# Patient Record
Sex: Female | Born: 1985 | Race: White | Hispanic: No | Marital: Single | Smoking: Never smoker
Health system: Southern US, Community
[De-identification: ages and names within clinical notes are randomized; demographics above are authoritative.]

## PROBLEM LIST (undated history)

## (undated) DIAGNOSIS — G43909 Migraine, unspecified, not intractable, without status migrainosus: Secondary | ICD-10-CM

## (undated) DIAGNOSIS — B019 Varicella without complication: Secondary | ICD-10-CM

## (undated) DIAGNOSIS — J45909 Unspecified asthma, uncomplicated: Secondary | ICD-10-CM

## (undated) DIAGNOSIS — I10 Essential (primary) hypertension: Secondary | ICD-10-CM

## (undated) HISTORY — DX: Essential (primary) hypertension: I10

## (undated) HISTORY — DX: Varicella without complication: B01.9

## (undated) HISTORY — PX: OTHER SURGICAL HISTORY: SHX169

## (undated) HISTORY — PX: TONSILLECTOMY: SUR1361

## (undated) HISTORY — PX: SKIN BIOPSY: SHX1

## (undated) HISTORY — DX: Migraine, unspecified, not intractable, without status migrainosus: G43.909

---

## 2018-01-23 DIAGNOSIS — K2100 Gastro-esophageal reflux disease with esophagitis, without bleeding: Secondary | ICD-10-CM | POA: Insufficient documentation

## 2018-01-23 DIAGNOSIS — G43009 Migraine without aura, not intractable, without status migrainosus: Secondary | ICD-10-CM | POA: Insufficient documentation

## 2018-01-23 DIAGNOSIS — I10 Essential (primary) hypertension: Secondary | ICD-10-CM | POA: Insufficient documentation

## 2018-01-23 DIAGNOSIS — M47812 Spondylosis without myelopathy or radiculopathy, cervical region: Secondary | ICD-10-CM | POA: Insufficient documentation

## 2018-09-30 DIAGNOSIS — A630 Anogenital (venereal) warts: Secondary | ICD-10-CM | POA: Insufficient documentation

## 2018-09-30 DIAGNOSIS — Z9889 Other specified postprocedural states: Secondary | ICD-10-CM | POA: Insufficient documentation

## 2018-11-07 DIAGNOSIS — Z01419 Encounter for gynecological examination (general) (routine) without abnormal findings: Secondary | ICD-10-CM | POA: Insufficient documentation

## 2018-11-20 DIAGNOSIS — R87610 Atypical squamous cells of undetermined significance on cytologic smear of cervix (ASC-US): Secondary | ICD-10-CM | POA: Insufficient documentation

## 2018-12-20 ENCOUNTER — Encounter (HOSPITAL_BASED_OUTPATIENT_CLINIC_OR_DEPARTMENT_OTHER): Payer: Self-pay | Admitting: Emergency Medicine

## 2018-12-20 ENCOUNTER — Emergency Department (HOSPITAL_BASED_OUTPATIENT_CLINIC_OR_DEPARTMENT_OTHER)
Admission: EM | Admit: 2018-12-20 | Discharge: 2018-12-21 | Disposition: A | Discharge status: Home / Self Care | Payer: Medicaid Other | Attending: Emergency Medicine | Admitting: Emergency Medicine

## 2018-12-20 ENCOUNTER — Other Ambulatory Visit: Payer: Self-pay

## 2018-12-20 DIAGNOSIS — J45909 Unspecified asthma, uncomplicated: Secondary | ICD-10-CM | POA: Insufficient documentation

## 2018-12-20 DIAGNOSIS — R430 Anosmia: Secondary | ICD-10-CM | POA: Diagnosis not present

## 2018-12-20 DIAGNOSIS — U071 COVID-19: Secondary | ICD-10-CM | POA: Insufficient documentation

## 2018-12-20 DIAGNOSIS — Z20822 Contact with and (suspected) exposure to covid-19: Secondary | ICD-10-CM

## 2018-12-20 DIAGNOSIS — Z20828 Contact with and (suspected) exposure to other viral communicable diseases: Secondary | ICD-10-CM

## 2018-12-20 DIAGNOSIS — G501 Atypical facial pain: Secondary | ICD-10-CM | POA: Diagnosis present

## 2018-12-20 HISTORY — DX: Unspecified asthma, uncomplicated: J45.909

## 2018-12-20 NOTE — ED Triage Notes (Signed)
Patient states that she has had a sinus infection x 3 -4 months. She has been to multiple drs to assist with this has been referred to ENT for follow up but they will not see her because she lost her sense of taste and smell 3 weeks ago - they want her to get COVID testing prior to seeing her  - patient states that she continues to have sinus pain and pressure. The patient states that she has pressure in her head and behind her eyes

## 2018-12-20 NOTE — ED Provider Notes (Signed)
[facility] EMERGENC[facility] DEPARTMENT Provider Note   CSN: [id number] Arrival date & time: 12/20/18  2324     History   Chief Complaint Chief Complaint  Patient presents with  . Facial Pain    HPI Rebecca Blackburn is a 33 y.o. female.     The history is provided by the patient.  Illness Location:  Nose and face Quality:  Popping of ear and chronic congestion for months no 3 weeks of anosmia and lack of taste.  Is here because ENT will not see her without a covid test Severity:  Moderate Onset quality:  Gradual Timing:  Constant Progression:  Worsening Chronicity:  Chronic Context:  Ongoing congestions and ear popping anosmia is new in 3 weeks Relieved by:  Nothing Worsened by:  Nothing Ineffective treatments:  None tried Associated symptoms: no abdominal pain, no chest pain, no congestion, no cough, no diarrhea, no ear pain, no fatigue, no fever, no headaches, no loss of consciousness, no myalgias, no nausea, no rash, no rhinorrhea, no shortness of breath, no sore throat, no vomiting and no wheezing   Risk factors:  None Went to see ENT for ongoing issues but they declined to see her when she told them she had anosmia and loss of taste and told her she needed a negative covid test.    Past Medical History:  Diagnosis Date  . Asthma     There are no active problems to display for this patient.   History reviewed. No pertinent surgical history.   OB History   No obstetric history on file.      Home Medications    Prior to Admission medications   Not on File    Family History History reviewed. No pertinent family history.  Social History Social History   Tobacco Use  . Smoking status: Never Smoker  . Smokeless tobacco: Never Used  Substance Use Topics  . Alcohol use: Never    Frequency: Never  . Drug use: Never     Allergies   Amoxicillin, Cefaclor, and Cefuroxime axetil   Review of Systems Review of Systems  Constitutional: Negative  for fatigue and fever.  HENT: Negative for congestion, ear pain, rhinorrhea and sore throat.   Eyes: Negative for visual disturbance.  Respiratory: Negative for cough, shortness of breath and wheezing.   Cardiovascular: Negative for chest pain.  Gastrointestinal: Negative for abdominal pain, diarrhea, nausea and vomiting.  Genitourinary: Negative for difficulty urinating.  Musculoskeletal: Negative for myalgias.  Skin: Negative for rash.  Neurological: Negative for loss of consciousness and headaches.  Psychiatric/Behavioral: Negative for agitation.  All other systems reviewed and are negative.    Physical Exam Updated Vital Signs BP 133/80 (BP Location: Left Arm)   Pulse 75   Temp 98.4 F (36.9 C) (Oral)   Resp 18   Ht 5\' 1"  (1.549 m)   Wt 103.4 kg   LMP 11/27/2018   SpO2 100%   BMI 43.08 kg/m   Physical Exam Vitals signs and nursing note reviewed.  Constitutional:      General: She is not in acute distress.    Appearance: Normal appearance. She is not ill-appearing.  HENT:     Head: Normocephalic and atraumatic.     Right Ear: Tympanic membrane normal.     Left Ear: Tympanic membrane normal.     Nose: Nose normal. No rhinorrhea.  Eyes:     Conjunctiva/sclera: Conjunctivae normal.     Pupils: Pupils are equal, round, and reactive to light.  Neck:     Musculoskeletal: Normal range of motion and neck supple.  Cardiovascular:     Rate and Rhythm: Normal rate and regular rhythm.     Pulses: Normal pulses.     Heart sounds: Normal heart sounds.  Pulmonary:     Effort: Pulmonary effort is normal.     Breath sounds: Normal breath sounds.  Abdominal:     General: Abdomen is flat. Bowel sounds are normal.     Tenderness: There is no abdominal tenderness. There is no guarding.  Musculoskeletal: Normal range of motion.  Skin:    General: Skin is warm and dry.     Capillary Refill: Capillary refill takes less than 2 seconds.  Neurological:     General: No focal deficit  present.     Mental Status: She is alert and oriented to person, place, and time.  Psychiatric:        Mood and Affect: Mood normal.        Behavior: Behavior normal.      ED Treatments / Results  Labs (all labs ordered are listed, but only abnormal results are displayed) Labs Reviewed  NOVEL CORONAVIRUS, NAA (HOSP ORDER, SEND-OUT TO REF LAB; TAT 18-24 HRS)    EKG None  Radiology No results found.  Procedures Procedures (including critical care time)  Medications Ordered in ED Medications - No data to display    Issues are chronic in nature.  She meets no criteria for acute sinus infection, no fevers nor discharge nor sinus tenderness on exam.  She has been seen multiple times for the issues and was told she needs to see ENT.  We have performed covid swabbing and patient has been given strict home isolation instructions pending results.  If results is positive she is to self quarantine for 16 days.  If negative she is to quarantine until symptom free.  Isolation instructions also provided on discharge paperwork.   Rebecca Blackburn was evaluated in Emergency Department on 12/21/2018 for the symptoms described in the history of present illness. She was evaluated in the context of the global COVID-19 pandemic, which necessitated consideration that the patient might be at risk for infection with the SARS-CoV-2 virus that causes COVID-19. Institutional protocols and algorithms that pertain to the evaluation of patients at risk for COVID-19 are in a state of rapid change based on information released by regulatory bodies including the CDC and federal and state organizations. These policies and algorithms were followed during the patient's care in the ED.    Final Clinical Impressions(s) / ED Diagnoses   Return for weakness, numbness, changes in vision or speech, fevers >100.4 unrelieved by medication, shortness of breath, intractable vomiting, or diarrhea, abdominal pain, Inability to  tolerate liquids or food, cough, altered mental status or any concerns. No signs of systemic illness or infection. The patient is nontoxic-appearing on exam and vital signs are within normal limits.   I have reviewed the triage vital signs and the nursing notes. Pertinent labs &imaging results that were available during my care of the patient were reviewed by me and considered in my medical decision making (see chart for details).  After history, exam, and medical workup I feel the patient has been appropriately medically screened and is safe for discharge home. Pertinent diagnoses were discussed with the patient. Patient was given return precautions.   Ottilie Wigglesworth, MD 12/21/18 0300

## 2018-12-21 ENCOUNTER — Encounter (HOSPITAL_BASED_OUTPATIENT_CLINIC_OR_DEPARTMENT_OTHER): Payer: Self-pay | Admitting: Emergency Medicine

## 2018-12-21 MED ORDER — ACETAMINOPHEN 500 MG PO TABS
1000.0000 mg | ORAL_TABLET | Freq: Once | ORAL | Status: AC
  Administered 2018-12-21: 1000 mg via ORAL
  Filled 2018-12-21: qty 2

## 2018-12-21 MED ORDER — AZELASTINE HCL 0.1 % NA SOLN: 2.0000 | Freq: Two times a day (BID) | NASAL | 0 refills | Status: DC

## 2018-12-21 MED ORDER — LORATADINE 10 MG PO TABS
10.0000 mg | ORAL_TABLET | Freq: Once | ORAL | Status: AC
  Administered 2018-12-21: 10 mg via ORAL
  Filled 2018-12-21: qty 1

## 2018-12-21 NOTE — Discharge Instructions (Addendum)
Person Under Monitoring Name: Rebecca Blackburn  Location: [place] [address]   Infection Prevention Recommendations for Individuals Confirmed to have, or Being Evaluated for, 2019 Novel Coronavirus (COVID-19) Infection Who Receive Care at Home  Individuals who are confirmed to have, or are being evaluated for, COVID-19 should follow the prevention steps below until a healthcare provider or local or state health department says they can return to normal activities.  Stay home except to get medical care You should restrict activities outside your home, except for getting medical care. Do not go to work, school, or public areas, and do not use public transportation or taxis.  Call ahead before visiting your doctor Before your medical appointment, call the healthcare provider and tell them that you have, or are being evaluated for, COVID-19 infection. This will help the healthcare providers office take steps to keep other people from getting infected. Ask your healthcare provider to call the local or state health department.  Monitor your symptoms Seek prompt medical attention if your illness is worsening (e.g., difficulty breathing). Before going to your medical appointment, call the healthcare provider and tell them that you have, or are being evaluated for, COVID-19 infection. Ask your healthcare provider to call the local or state health department.  Wear a facemask You should wear a facemask that covers your nose and mouth when you are in the same room with other people and when you visit a healthcare provider. People who live with or visit you should also wear a facemask while they are in the same room with you.  Separate yourself from other people in your home As much as possible, you should stay in a different room from other people in your home. Also, you should use a separate bathroom, if available.  Avoid sharing household items You should  not share dishes, drinking glasses, cups, eating utensils, towels, bedding, or other items with other people in your home. After using these items, you should wash them thoroughly with soap and water.  Cover your coughs and sneezes Cover your mouth and nose with a tissue when you cough or sneeze, or you can cough or sneeze into your sleeve. Throw used tissues in a lined trash can, and immediately wash your hands with soap and water for at least 20 seconds or use an alcohol-based hand rub.  Wash your [facility] your hands often and thoroughly with soap and water for at least 20 seconds. You can use an alcohol-based hand sanitizer if soap and water are not available and if your hands are not visibly dirty. Avoid touching your eyes, nose, and mouth with unwashed hands.   Prevention Steps for Caregivers and Household Members of Individuals Confirmed to have, or Being Evaluated for, COVID-19 Infection Being Cared for in the Home  If you live with, or provide care at home for, a person confirmed to have, or being evaluated for, COVID-19 infection please follow these guidelines to prevent infection:  Follow healthcare providers instructions Make sure that you understand and can help the patient follow any healthcare provider instructions for all care.  Provide for the patients basic needs You should help the patient with basic needs in the home and provide support for getting groceries, prescriptions, and other personal needs.  Monitor the patients symptoms If they are getting sicker, call his or her medical provider and tell them that the patient has, or is being evaluated for, COVID-19 infection. This will help the healthcare  providers office take steps to keep other people from getting infected. Ask the healthcare provider to call the local or state health department.  Limit the number of people who have contact with the patient If possible, have only one caregiver for the  patient. Other household members should stay in another home or place of residence. If this is not possible, they should stay in another room, or be separated from the patient as much as possible. Use a separate bathroom, if available. Restrict visitors who do not have an essential need to be in the home.  Keep older adults, very young children, and other sick people away from the patient Keep older adults, very young children, and those who have compromised immune systems or chronic health conditions away from the patient. This includes people with chronic heart, lung, or kidney conditions, diabetes, and cancer.  Ensure good ventilation Make sure that shared spaces in the home have good air flow, such as from an air conditioner or an opened window, weather permitting.  Wash your hands often Wash your hands often and thoroughly with soap and water for at least 20 seconds. You can use an alcohol based hand sanitizer if soap and water are not available and if your hands are not visibly dirty. Avoid touching your eyes, nose, and mouth with unwashed hands. Use disposable paper towels to dry your hands. If not available, use dedicated cloth towels and replace them when they become wet.  Wear a facemask and gloves Wear a disposable facemask at all times in the room and gloves when you touch or have contact with the patients blood, body fluids, and/or secretions or excretions, such as sweat, saliva, sputum, nasal mucus, vomit, urine, or feces.  Ensure the mask fits over your nose and mouth tightly, and do not touch it during use. Throw out disposable facemasks and gloves after using them. Do not reuse. Wash your hands immediately after removing your facemask and gloves. If your personal clothing becomes contaminated, carefully remove clothing and launder. Wash your hands after handling contaminated clothing. Place all used disposable facemasks, gloves, and other waste in a lined container before  disposing them with other household waste. Remove gloves and wash your hands immediately after handling these items.  Do not share dishes, glasses, or other household items with the patient Avoid sharing household items. You should not share dishes, drinking glasses, cups, eating utensils, towels, bedding, or other items with a patient who is confirmed to have, or being evaluated for, COVID-19 infection. After the person uses these items, you should wash them thoroughly with soap and water.  Wash laundry thoroughly Immediately remove and wash clothes or bedding that have blood, body fluids, and/or secretions or excretions, such as sweat, saliva, sputum, nasal mucus, vomit, urine, or feces, on them. Wear gloves when handling laundry from the patient. Read and follow directions on labels of laundry or clothing items and detergent. In general, wash and dry with the warmest temperatures recommended on the label.  Clean all areas the individual has used often Clean all touchable surfaces, such as counters, tabletops, doorknobs, bathroom fixtures, toilets, phones, keyboards, tablets, and bedside tables, every day. Also, clean any surfaces that may have blood, body fluids, and/or secretions or excretions on them. Wear gloves when cleaning surfaces the patient has come in contact with. Use a diluted bleach solution (e.g., dilute bleach with 1 part bleach and 10 parts water) or a household disinfectant with a label that says EPA-registered for coronaviruses. To make  a bleach solution at home, add 1 tablespoon of bleach to 1 quart (4 cups) of water. For a larger supply, add  cup of bleach to 1 gallon (16 cups) of water. Read labels of cleaning products and follow recommendations provided on product labels. Labels contain instructions for safe and effective use of the cleaning product including precautions you should take when applying the product, such as wearing gloves or eye protection and making sure you  have good ventilation during use of the product. Remove gloves and wash hands immediately after cleaning.  Monitor yourself for signs and symptoms of illness Caregivers and household members are considered close contacts, should monitor their health, and will be asked to limit movement outside of the home to the extent possible. Follow the monitoring steps for close contacts listed on the symptom monitoring form.   ? If you have additional questions, contact your local health department or call the epidemiologist on call at [phone] (available 24/7). ? This guidance is subject to change. For the most up-to-date guidance from [facility], please refer to their website: [email or web address]

## 2018-12-22 LAB — NOVEL CORONAVIRUS, NAA (HOSP ORDER, SEND-OUT TO REF LAB; TAT 18-24 HRS): SARS-CoV-2, NAA: DETECTED — AB

## 2019-01-01 ENCOUNTER — Other Ambulatory Visit: Payer: Self-pay

## 2019-01-01 ENCOUNTER — Emergency Department (HOSPITAL_BASED_OUTPATIENT_CLINIC_OR_DEPARTMENT_OTHER)
Admission: EM | Admit: 2019-01-01 | Discharge: 2019-01-01 | Disposition: A | Discharge status: Home / Self Care | Payer: Medicaid Other | Attending: Emergency Medicine | Admitting: Emergency Medicine

## 2019-01-01 ENCOUNTER — Encounter (HOSPITAL_BASED_OUTPATIENT_CLINIC_OR_DEPARTMENT_OTHER): Payer: Self-pay | Admitting: Emergency Medicine

## 2019-01-01 DIAGNOSIS — Z880 Allergy status to penicillin: Secondary | ICD-10-CM | POA: Diagnosis not present

## 2019-01-01 DIAGNOSIS — Z881 Allergy status to other antibiotic agents status: Secondary | ICD-10-CM | POA: Insufficient documentation

## 2019-01-01 DIAGNOSIS — R42 Dizziness and giddiness: Secondary | ICD-10-CM | POA: Diagnosis not present

## 2019-01-01 DIAGNOSIS — G44209 Tension-type headache, unspecified, not intractable: Secondary | ICD-10-CM | POA: Insufficient documentation

## 2019-01-01 DIAGNOSIS — J45909 Unspecified asthma, uncomplicated: Secondary | ICD-10-CM | POA: Diagnosis not present

## 2019-01-01 DIAGNOSIS — R5383 Other fatigue: Secondary | ICD-10-CM | POA: Insufficient documentation

## 2019-01-01 DIAGNOSIS — R519 Headache, unspecified: Secondary | ICD-10-CM

## 2019-01-01 DIAGNOSIS — Z79899 Other long term (current) drug therapy: Secondary | ICD-10-CM | POA: Diagnosis not present

## 2019-01-01 LAB — URINALYSIS, ROUTINE W REFLEX MICROSCOPIC
Bilirubin Urine: NEGATIVE
Glucose, UA: NEGATIVE mg/dL
Ketones, ur: NEGATIVE mg/dL
Leukocytes,Ua: NEGATIVE
Nitrite: NEGATIVE
Protein, ur: NEGATIVE mg/dL
Specific Gravity, Urine: 1.02 (ref 1.005–1.030)
pH: 5.5 (ref 5.0–8.0)

## 2019-01-01 LAB — URINALYSIS, MICROSCOPIC (REFLEX)

## 2019-01-01 LAB — CBC WITH DIFFERENTIAL/PLATELET
Abs Immature Granulocytes: 0.03 10*3/uL (ref 0.00–0.07)
Basophils Absolute: 0 10*3/uL (ref 0.0–0.1)
Basophils Relative: 0 %
Eosinophils Absolute: 0.1 10*3/uL (ref 0.0–0.5)
Eosinophils Relative: 1 %
HCT: 42.9 % (ref 36.0–46.0)
Hemoglobin: 13.9 g/dL (ref 12.0–15.0)
Immature Granulocytes: 0 %
Lymphocytes Relative: 28 %
Lymphs Abs: 2.4 10*3/uL (ref 0.7–4.0)
MCH: 28.1 pg (ref 26.0–34.0)
MCHC: 32.4 g/dL (ref 30.0–36.0)
MCV: 86.8 fL (ref 80.0–100.0)
Monocytes Absolute: 0.4 10*3/uL (ref 0.1–1.0)
Monocytes Relative: 5 %
Neutro Abs: 5.8 10*3/uL (ref 1.7–7.7)
Neutrophils Relative %: 66 %
Platelets: 254 10*3/uL (ref 150–400)
RBC: 4.94 MIL/uL (ref 3.87–5.11)
RDW: 13.4 % (ref 11.5–15.5)
WBC: 8.7 10*3/uL (ref 4.0–10.5)
nRBC: 0 % (ref 0.0–0.2)

## 2019-01-01 LAB — COMPREHENSIVE METABOLIC PANEL
ALT: 16 U/L (ref 0–44)
AST: 17 U/L (ref 15–41)
Albumin: 3.9 g/dL (ref 3.5–5.0)
Alkaline Phosphatase: 62 U/L (ref 38–126)
Anion gap: 8 (ref 5–15)
BUN: 15 mg/dL (ref 6–20)
CO2: 24 mmol/L (ref 22–32)
Calcium: 9 mg/dL (ref 8.9–10.3)
Chloride: 104 mmol/L (ref 98–111)
Creatinine, Ser: 0.72 mg/dL (ref 0.44–1.00)
GFR calc Af Amer: >60 mL/min (ref >60)
GFR calc non Af Amer: >60 mL/min (ref >60)
Glucose, Bld: 106 mg/dL — ABNORMAL HIGH (ref 70–99)
Potassium: 3.4 mmol/L — ABNORMAL LOW (ref 3.5–5.1)
Sodium: 136 mmol/L (ref 135–145)
Total Bilirubin: 0.3 mg/dL (ref 0.3–1.2)
Total Protein: 6.9 g/dL (ref 6.5–8.1)

## 2019-01-01 LAB — PREGNANCY, URINE: Preg Test, Ur: NEGATIVE

## 2019-01-01 MED ORDER — SODIUM CHLORIDE 0.9 % IV BOLUS
1000.0000 mL | Freq: Once | INTRAVENOUS | Status: AC
  Administered 2019-01-01: 1000 mL via INTRAVENOUS

## 2019-01-01 MED ORDER — KETOROLAC TROMETHAMINE 15 MG/ML IJ SOLN
15.0000 mg | Freq: Once | INTRAMUSCULAR | Status: AC
  Administered 2019-01-01: 15 mg via INTRAVENOUS
  Filled 2019-01-01: qty 1

## 2019-01-01 MED ORDER — POTASSIUM CHLORIDE CRYS ER 20 MEQ PO TBCR
20.0000 meq | EXTENDED_RELEASE_TABLET | Freq: Once | ORAL | Status: AC
  Administered 2019-01-01: 20 meq via ORAL
  Filled 2019-01-01: qty 1

## 2019-01-01 NOTE — ED Provider Notes (Signed)
[facility] EM[facility]ENC[facility] DEPARTMENT Provider Note   CSN: [id number] Arrival date & time: 01/01/19  1232     History   Chief Complaint Chief Complaint  Patient presents with  . Headache    HPI Rebecca Blackburn is a 33 y.o. female.     33 year old female with past medical history including asthma who presents with headaches.  The patient presented here on 12/20/2018 and ultimately tested positive for COVID-19.  She has had loss of taste and smell, fatigues and remittent headaches.  She reports longstanding history of occasional migraines for which she used to be on Topamax; she took himself off of the medication 3 years ago.  She notes that yesterday she began feeling lightheaded and generally weak. She noted heart racing/palpitations feeling. She states her symptoms felt like an anxiety attack. She has had similar weakness symptoms before just prior to onset of headache.  She then began having a right-sided, muscle cramp feeling headache. She took flexeril and tylenol last night. She currently has dull headache. She continues to feel general fatigue. Occasional dry cough but no severe cough. No breathing problems. She reports distant h/o low potassium while on fluid pill and is currently following Keto diet; she's concerned that one of her electrolytes might be off balance. No vision changes.  The history is provided by the patient.  Headache   Past Medical History:  Diagnosis Date  . Asthma     There are no active problems to display for this patient.   History reviewed. No pertinent surgical history.   OB History   No obstetric history on file.      Home Medications    Prior to Admission medications   Medication Sig Start Date End Date Taking? Authorizing Provider  albuterol (PROAIR HFA) 108 (90 Base) MCG/ACT inhaler INHALE 2 PUFFS 3 TIMES A DAY 05/19/16  Yes [provider]  cyclobenzaprine (FLEXERIL) 10 MG tablet Take by mouth. 09/11/18 09/11/19 Yes  [provider]  azelastine (ASTELIN) 0.1 % nasal spray Place 2 sprays into both nostrils 2 (two) times daily. Use in each nostril as directed 12/21/18   Palumbo, April, MD  losartan (COZAAR) 100 MG tablet Take 100 mg by mouth daily. 09/15/18   [provider]    Family History No family history on file.  Social History Social History   Tobacco Use  . Smoking status: Never Smoker  . Smokeless tobacco: Never Used  Substance Use Topics  . Alcohol use: Never    Frequency: Never  . Drug use: Never     Allergies   Amoxicillin, Cefaclor, and Cefuroxime axetil   Review of Systems Review of Systems  Neurological: Positive for headaches.   All other systems reviewed and are negative except that which was mentioned in HPI   Physical Exam Updated Vital Signs BP 116/70 (BP Location: Right Arm)   Pulse 95   Temp 98.8 F (37.1 C) (Oral)   Resp 16   Ht 5\' 1"  (1.549 m)   Wt 104.3 kg   LMP 12/27/2018   SpO2 100%   BMI 43.46 kg/m   Physical Exam Vitals signs and nursing note reviewed.  Constitutional:      General: She is not in acute distress.    Appearance: She is well-developed.     Comments: Awake, alert  HENT:     Head: Normocephalic and atraumatic.  Eyes:     Extraocular Movements: Extraocular movements intact.     Conjunctiva/sclera: Conjunctivae normal.  Pupils: Pupils are equal, round, and reactive to light.  Neck:     Musculoskeletal: Neck supple.  Pulmonary:     Effort: Pulmonary effort is normal.  Abdominal:     General: There is no distension.  Skin:    General: Skin is warm and dry.  Neurological:     Mental Status: She is alert and oriented to person, place, and time.     Cranial Nerves: No cranial nerve deficit.     Motor: No abnormal muscle tone.     Deep Tendon Reflexes: Reflexes are normal and symmetric.     Comments: Fluent speech, normal finger-to-nose testing, negative pronator drift, no clonus 5/5 strength and normal  sensation x all 4 extremities  Psychiatric:        Mood and Affect: Mood normal.        Thought Content: Thought content normal.        Judgment: Judgment normal.      ED Treatments / Results  Labs (all labs ordered are listed, but only abnormal results are displayed) Labs Reviewed  CBC WITH DIFFERENTIAL/PLATELET  COMPREHENSIVE METABOLIC PANEL  PREGNANCY, URINE  URINALYSIS, ROUTINE W REFLEX MICROSCOPIC    EKG EKG Interpretation  Date/Time:  Thursday January 01 2019 14:06:01 EDT Ventricular Rate:  81 PR Interval:    QRS Duration: 94 QT Interval:  368 QTC Calculation: 428 R Axis:   72 Text Interpretation:  Sinus rhythm No previous ECGs available Confirmed by Theotis Burrow [phone]) on 01/01/2019 2:21:01 PM   Radiology No results found.  Procedures Procedures (including critical care time)  Medications Ordered in ED Medications  sodium chloride 0.9 % bolus 1,000 mL (1,000 mLs Intravenous New Bag/Given 01/01/19 1419)     Initial Impression / Assessment and Plan / ED Course  I have reviewed the triage vital signs and the nursing notes.  Pertinent labs that were available during my care of the patient were reviewed by me and considered in my medical decision making (see chart for details).       Neurologically intact on exam. VS reassuring. No sudden onset of headache or neuro deficits to suggest acute intracranial process.   I explained that fatigue/weakness may be lingering symptom of COVID-19 infection and we currently don't have enough evidence to predict how long lingering symptoms take to resolve. I also discussed need to f/u with PCP for further w/u.  I have ordered screening labs to evaluate electrolytes.  CBC is normal, no evidence of anemia.  The remainder of her lab work is pending and I am signing out to the oncoming provider.  Final Clinical Impressions(s) / ED Diagnoses   Final diagnoses:  None    ED Discharge Orders    None       ,  Wenda Overland, MD 01/01/19 [phone]

## 2019-01-01 NOTE — ED Triage Notes (Signed)
Pt c/o persistent HA; hx of migraines, but not taking Topamax; feels fatigued; tested positive for COVID on 12/20/18

## 2019-01-01 NOTE — ED Notes (Signed)
Patient transported to CT 

## 2019-01-13 DIAGNOSIS — M26629 Arthralgia of temporomandibular joint, unspecified side: Secondary | ICD-10-CM | POA: Insufficient documentation

## 2019-01-13 DIAGNOSIS — R42 Dizziness and giddiness: Secondary | ICD-10-CM | POA: Insufficient documentation

## 2019-01-19 ENCOUNTER — Encounter: Payer: Self-pay | Admitting: Physical Therapy

## 2019-01-19 ENCOUNTER — Ambulatory Visit: Payer: Medicaid Other | Attending: Otolaryngology | Admitting: Physical Therapy

## 2019-01-19 ENCOUNTER — Other Ambulatory Visit: Payer: Self-pay

## 2019-01-19 DIAGNOSIS — M542 Cervicalgia: Secondary | ICD-10-CM

## 2019-01-19 DIAGNOSIS — R42 Dizziness and giddiness: Secondary | ICD-10-CM | POA: Diagnosis present

## 2019-01-19 NOTE — Patient Instructions (Signed)
Access Code: [email or web address]  URL: [email or web address]  Date: 01/19/2019  Prepared by: Elsie Ra   Exercises  Seated Cervical Sidebending Stretch - 3 sets - 30 sec hold - 2x daily - 6x weekly  Seated Cervical Retraction - 10 reps - 2-3 sets - 2x daily - 6x weekly  Seated Gaze Stabilization with Head Rotation - 1 reps - 2 sets - 30 hold - 2x daily - 6x weekly  Seated Gaze Stabilization with Head Nod - 2 sets - 30 sec hold - 2x daily - 6x weekly  Supine Suboccipital Release with Tennis Balls - 1-3 sets - 1-2 min hold - 2x daily - 6x weekly  Patient Education  TENS Therapy
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
## 2019-01-27 ENCOUNTER — Other Ambulatory Visit: Payer: Self-pay

## 2019-01-27 ENCOUNTER — Ambulatory Visit: Payer: Medicaid Other | Admitting: Physical Therapy

## 2019-01-27 DIAGNOSIS — M542 Cervicalgia: Secondary | ICD-10-CM

## 2019-01-27 DIAGNOSIS — R42 Dizziness and giddiness: Secondary | ICD-10-CM
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
## 2019-02-09 ENCOUNTER — Other Ambulatory Visit: Payer: Self-pay

## 2019-02-09 ENCOUNTER — Ambulatory Visit: Payer: Medicaid Other | Admitting: Physical Therapy

## 2019-02-09 ENCOUNTER — Encounter: Payer: Self-pay | Admitting: Physical Therapy

## 2019-02-09 DIAGNOSIS — M542 Cervicalgia: Secondary | ICD-10-CM | POA: Diagnosis not present

## 2019-02-09 DIAGNOSIS — R42 Dizziness and giddiness: Secondary | ICD-10-CM
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
## 2019-02-09 NOTE — Patient Instructions (Signed)
Feet Apart (Compliant Surface) Head Motion - Eyes Closed    Stand on compliant surface: _pillows_______ with feet shoulder width apart. Close eyes and move head slowly, up and down. Repeat __10__ times per session. Do _1___ sessions per day.  Copyright  VHI. All rights reserved.

## 2019-02-13 DIAGNOSIS — L219 Seborrheic dermatitis, unspecified: Secondary | ICD-10-CM | POA: Insufficient documentation

## 2019-02-26 ENCOUNTER — Encounter (HOSPITAL_BASED_OUTPATIENT_CLINIC_OR_DEPARTMENT_OTHER): Payer: Self-pay | Admitting: Emergency Medicine

## 2019-02-26 ENCOUNTER — Emergency Department (HOSPITAL_BASED_OUTPATIENT_CLINIC_OR_DEPARTMENT_OTHER)
Admission: EM | Admit: 2019-02-26 | Discharge: 2019-02-26 | Disposition: A | Discharge status: Home / Self Care | Payer: Medicaid Other | Attending: Emergency Medicine | Admitting: Emergency Medicine

## 2019-02-26 ENCOUNTER — Other Ambulatory Visit: Payer: Self-pay

## 2019-02-26 ENCOUNTER — Emergency Department (HOSPITAL_BASED_OUTPATIENT_CLINIC_OR_DEPARTMENT_OTHER): Payer: Medicaid Other

## 2019-02-26 DIAGNOSIS — J45909 Unspecified asthma, uncomplicated: Secondary | ICD-10-CM | POA: Insufficient documentation

## 2019-02-26 DIAGNOSIS — Z79899 Other long term (current) drug therapy: Secondary | ICD-10-CM | POA: Insufficient documentation

## 2019-02-26 DIAGNOSIS — G501 Atypical facial pain: Secondary | ICD-10-CM | POA: Insufficient documentation

## 2019-02-26 DIAGNOSIS — R519 Headache, unspecified: Secondary | ICD-10-CM | POA: Insufficient documentation

## 2019-02-26 HISTORY — DX: Migraine, unspecified, not intractable, without status migrainosus: G43.909

## 2019-02-26 LAB — PREGNANCY, URINE: Preg Test, Ur: NEGATIVE

## 2019-02-26 MED ORDER — KETOROLAC TROMETHAMINE 30 MG/ML IJ SOLN
30.0000 mg | Freq: Once | INTRAMUSCULAR | Status: AC
  Administered 2019-02-26: 30 mg via INTRAMUSCULAR
  Filled 2019-02-26: qty 1

## 2019-02-26 NOTE — ED Notes (Signed)
ED Provider at bedside. 

## 2019-02-26 NOTE — ED Triage Notes (Addendum)
Pt is c/o headache  Pt states she has had a headache every single day since she was last seen here  Pt states she has severe pressure in her face mainly on the right side and she gets real sharp pains that shoot up the right side of her neck and head  Pt had vertigo about a month ago that comes and goes  Pt has had some nausea associated with the headaches  Pt adds that she had COVID in October and the headaches started after that

## 2019-02-26 NOTE — ED Provider Notes (Signed)
[facility] Provider Note: Georgena Spurling, MD, FACEP  CSN: [id number] MRN: [id number] ARRIVAL: 02/26/19 at [place]: [place]  Headache   HISTORY OF PRESENT ILLNESS  02/26/19 1:24 AM Rebecca Blackburn is a 33 y.o. female who had COVID-19 in October.  Since then she has had daily headaches.  They vary in location and she describes them as a pressure in her head like her head is about to explode.  Sometimes she has sharp pains that shoot up the right side of her head and neck.  Sometimes she gets photophobia but not now.  Sometimes she gets vertigo but not now.  She has been taking Tylenol with transient relief.  She does not like to repeat doses of Tylenol so frequently the headaches go untreated.  She is here with a another typical headache which she rates as a 5 out of 10.  She has an appointment with a neurologist next month but is concerned there may be "something going on" (her grandmother had a brain tumor) and would like a CT scan.   Past Medical History:  Diagnosis Date  . Asthma   . Migraine     Past Surgical History:  Procedure Laterality Date  . CESAREAN SECTION    . TONSILLECTOMY      Family History  Problem Relation Age of Onset  . Hypertension Mother     Social History   Tobacco Use  . Smoking status: Never Smoker  . Smokeless tobacco: Never Used  Substance Use Topics  . Alcohol use: Never  . Drug use: Never    Prior to Admission medications   Medication Sig Start Date End Date Taking? Authorizing Provider  albuterol (PROAIR HFA) 108 (90 Base) MCG/ACT inhaler INHALE 2 PUFFS 3 TIMES A DAY 05/19/16   [provider]  azelastine (ASTELIN) 0.1 % nasal spray Place 2 sprays into both nostrils 2 (two) times daily. Use in each nostril as directed 12/21/18   Palumbo, April, MD  cyclobenzaprine (FLEXERIL) 10 MG tablet Take by mouth. 09/11/18 09/11/19  [provider]  losartan (COZAAR) 100 MG tablet Take 100 mg by mouth  daily. 09/15/18   [provider]  meclizine (ANTIVERT) 25 MG tablet Take 25 mg by mouth 3 (three) times daily as needed for dizziness.    [provider]    Allergies Amoxicillin, Cefaclor, and Cefuroxime axetil   REVIEW OF SYSTEMS  Negative except as noted here or in the History of Present Illness.   PHYSICAL EXAMINATION  Initial Vital Signs Blood pressure 119/76, pulse 72, temperature 97.6 F (36.4 C), temperature source Oral, resp. rate 16, height 5\' 1"  (1.549 m), weight 104.3 kg, last menstrual period 02/15/2019, SpO2 100 %.  Examination General: Well-developed, well-nourished female in no acute distress; appearance consistent with age of record HENT: normocephalic; atraumatic; TMs normal Eyes: pupils equal, round and reactive to light; extraocular muscles intact Neck: supple Heart: regular rate and rhythm Lungs: clear to auscultation bilaterally Abdomen: soft; nondistended; nontender; bowel sounds present Extremities: No deformity; full range of motion; pulses normal Neurologic: Awake, alert and oriented; motor function intact in all extremities and symmetric; no facial droop Skin: Warm and dry Psychiatric: Normal mood and affect   RESULTS  Summary of this visit's results, reviewed and interpreted by myself:   EKG Interpretation  Date/Time:    Ventricular Rate:    PR Interval:    QRS Duration:   QT Interval:    QTC Calculation:  R Axis:     Text Interpretation:        Laboratory Studies: Results for orders placed or performed during the hospital encounter of 02/26/19 (from the past 24 hour(s))  Pregnancy, urine     Status: None   Collection Time: 02/26/19  1:35 AM  Result Value Ref Range   Preg Test, Ur NEGATIVE NEGATIVE   Imaging Studies: CT Head Wo Contrast  Result Date: 02/26/2019 CLINICAL DATA:  Headache and facial pressure. COVID-19 positive 2 months ago. The headache since that time. EXAM: CT HEAD WITHOUT CONTRAST TECHNIQUE:  Contiguous axial images were obtained from the base of the skull through the vertex without intravenous contrast. COMPARISON:  None. FINDINGS: Brain: No evidence of acute infarction, hemorrhage, hydrocephalus, extra-axial collection or mass lesion/mass effect. Vascular: No hyperdense vessel or unexpected calcification. Skull: Normal. Negative for fracture or focal lesion. Sinuses/Orbits: Paranasal sinuses and mastoid air cells are clear. No mucosal thickening or fluid level. No mastoid effusion. The visualized orbits are unremarkable. Other: None. IMPRESSION: Negative noncontrast head CT. Electronically Signed   By: Keith Rake M.D.   On: 02/26/2019 01:59    ED COURSE and MDM  Nursing notes, initial and subsequent vitals signs, including pulse oximetry, reviewed and interpreted by myself.  Vitals:   02/26/19 0118 02/26/19 0121  BP:  119/76  Pulse:  72  Resp:  16  Temp:  97.6 F (36.4 C)  TempSrc:  Oral  SpO2:  100%  Weight: 104.3 kg   Height: 5\' 1"  (1.549 m)    Medications  ketorolac (TORADOL) 30 MG/ML injection 30 mg (30 mg Intramuscular Given 02/26/19 0151)   2:29 AM Headache improved after IM Toradol.  Patient advised of reassuring CT findings.  This could represent sequela from Covid infection.  As noted above she has a neurology appointment pending.   PROCEDURES  Procedures   ED DIAGNOSES     ICD-10-CM   1. Daily headache  R51.9        Benjimen Kelley, Jenny Reichmann, MD 02/26/19 [phone]

## 2019-02-26 NOTE — ED Notes (Signed)
Returned from CT.

## 2019-03-18 ENCOUNTER — Other Ambulatory Visit: Payer: Self-pay

## 2019-03-18 ENCOUNTER — Encounter: Payer: Self-pay | Admitting: Neurology

## 2019-03-18 ENCOUNTER — Ambulatory Visit: Payer: Medicaid Other | Admitting: Neurology

## 2019-03-18 VITALS — BP 105/66 | HR 74 | Temp 97.5°F | Ht 61.0 in | Wt 236.0 lb

## 2019-03-18 DIAGNOSIS — G43109 Migraine with aura, not intractable, without status migrainosus: Secondary | ICD-10-CM | POA: Insufficient documentation

## 2019-03-18 MED ORDER — KETOROLAC TROMETHAMINE 10 MG PO TABS: 10.0000 mg | ORAL_TABLET | Freq: Four times a day (QID) | ORAL | 6 refills | Status: DC | PRN

## 2019-03-18 MED ORDER — RIZATRIPTAN BENZOATE 10 MG PO TBDP: 10.0000 mg | ORAL_TABLET | ORAL | 11 refills | Status: DC | PRN

## 2019-03-18 NOTE — Patient Instructions (Addendum)
Maxalt(Please take one tablet at the onset of your headache. If it does not improve the symptoms please take one additional tablet. Do not take more then 2 tablets in 24hrs. Do not take use more then 2 to 3 times in a week.  Neck pain: Dry Needling  Rizatriptan disintegrating tablets What is this medicine? RIZATRIPTAN (rye za TRIP tan) is used to treat migraines with or without aura. An aura is a strange feeling or visual disturbance that warns you of an attack. It is not used to prevent migraines. This medicine may be used for other purposes; ask your health care provider or pharmacist if you have questions. COMMON BRAND NAME(S): Maxalt-MLT What should I tell my health care provider before I take this medicine? They need to know if you have any of these conditions:  cigarette smoker  circulation problems in fingers and toes  diabetes  heart disease  high blood pressure  high cholesterol  history of irregular heartbeat  history of stroke  kidney disease  liver disease  stomach or intestine problems  an unusual or allergic reaction to rizatriptan, other medicines, foods, dyes, or preservatives  pregnant or trying to get pregnant  breast-feeding How should I use this medicine? Take this medicine by mouth. Follow the directions on the prescription label. Leave the tablet in the sealed blister pack until you are ready to take it. With dry hands, open the blister and gently remove the tablet. If the tablet breaks or crumbles, throw it away and take a new tablet out of the blister pack. Place the tablet in the mouth and allow it to dissolve, and then swallow. Do not cut, crush, or chew this medicine. You do not need water to take this medicine. Do not take it more often than directed. Talk to your pediatrician regarding the use of this medicine in children. While this drug may be prescribed for children as young as 6 years for selected conditions, precautions do apply. Overdosage:  If you think you have taken too much of this medicine contact a poison control center or emergency room at once. NOTE: This medicine is only for you. Do not share this medicine with others. What if I miss a dose? This does not apply. This medicine is not for regular use. What may interact with this medicine? Do not take this medicine with any of the following medicines:  certain medicines for migraine headache like almotriptan, eletriptan, frovatriptan, naratriptan, rizatriptan, sumatriptan, zolmitriptan  ergot alkaloids like dihydroergotamine, ergonovine, ergotamine, methylergonovine  MAOIs like Carbex, Eldepryl, Marplan, Nardil, and Parnate This medicine may also interact with the following medications:  certain medicines for depression, anxiety, or psychotic disorders  propranolol This list may not describe all possible interactions. Give your health care provider a list of all the medicines, herbs, non-prescription drugs, or dietary supplements you use. Also tell them if you smoke, drink alcohol, or use illegal drugs. Some items may interact with your medicine. What should I watch for while using this medicine? Visit your healthcare professional for regular checks on your progress. Tell your healthcare professional if your symptoms do not start to get better or if they get worse. You may get drowsy or dizzy. Do not drive, use machinery, or do anything that needs mental alertness until you know how this medicine affects you. Do not stand up or sit up quickly, especially if you are an older patient. This reduces the risk of dizzy or fainting spells. Alcohol may interfere with the effect of  this medicine. Your mouth may get dry. Chewing sugarless gum or sucking hard candy and drinking plenty of water may help. Contact your healthcare professional if the problem does not go away or is severe. If you take migraine medicines for 10 or more days a month, your migraines may get worse. Keep a diary  of headache days and medicine use. Contact your healthcare professional if your migraine attacks occur more frequently. What side effects may I notice from receiving this medicine? Side effects that you should report to your doctor or health care professional as soon as possible:  allergic reactions like skin rash, itching or hives, swelling of the face, lips, or tongue  chest pain or chest tightness  signs and symptoms of a dangerous change in heartbeat or heart rhythm like chest pain; dizziness; fast, irregular heartbeat; palpitations; feeling faint or lightheaded; falls; breathing problems  signs and symptoms of a stroke like changes in vision; confusion; trouble speaking or understanding; severe headaches; sudden numbness or weakness of the face, arm or leg; trouble walking; dizziness; loss of balance or coordination  signs and symptoms of serotonin syndrome like irritable; confusion; diarrhea; fast or irregular heartbeat; muscle twitching; stiff muscles; trouble walking; sweating; high fever; seizures; chills; vomiting Side effects that usually do not require medical attention (report to your doctor or health care professional if they continue or are bothersome):  diarrhea  dizziness  drowsiness  dry mouth  headache  nausea, vomiting  pain, tingling, numbness in the hands or feet  stomach pain This list may not describe all possible side effects. Call your doctor for medical advice about side effects. You may report side effects to FDA at 1-800-FDA-1088. Where should I keep my medicine? Keep out of the reach of children. Store at room temperature between 15 and 30 degrees C (59 and 86 degrees F). Protect from light and moisture. Throw away any unused medicine after the expiration date. NOTE: This sheet is a summary. It may not cover all possible information. If you have questions about this medicine, talk to your doctor, pharmacist, or health care provider.  2020  Elsevier/Gold Standard (2017-09-10 14:58:08)  Migraine Headache A migraine headache is an intense, throbbing pain on one side or both sides of the head. Migraine headaches may also cause other symptoms, such as nausea, vomiting, and sensitivity to light and noise. A migraine headache can last from 4 hours to 3 days. Talk with your doctor about what things may bring on (trigger) your migraine headaches. What are the causes? The exact cause of this condition is not known. However, a migraine may be caused when nerves in the brain become irritated and release chemicals that cause inflammation of blood vessels. This inflammation causes pain. This condition may be triggered or caused by:  Drinking alcohol.  Smoking.  Taking medicines, such as: ? Medicine used to treat chest pain (nitroglycerin). ? Birth control pills. ? Estrogen. ? Certain blood pressure medicines.  Eating or drinking products that contain nitrates, glutamate, aspartame, or tyramine. Aged cheeses, chocolate, or caffeine may also be triggers.  Doing physical activity. Other things that may trigger a migraine headache include:  Menstruation.  Pregnancy.  Hunger.  Stress.  Lack of sleep or too much sleep.  Weather changes.  Fatigue. What increases the risk? The following factors may make you more likely to experience migraine headaches:  Being a certain age. This condition is more common in people who are 37-28 years old.  Being female.  Having a family  history of migraine headaches.  Being Caucasian.  Having a mental health condition, such as depression or anxiety.  Being obese. What are the signs or symptoms? The main symptom of this condition is pulsating or throbbing pain. This pain may:  Happen in any area of the head, such as on one side or both sides.  Interfere with daily activities.  Get worse with physical activity.  Get worse with exposure to bright lights or loud noises. Other symptoms  may include:  Nausea.  Vomiting.  Dizziness.  General sensitivity to bright lights, loud noises, or smells. Before you get a migraine headache, you may get warning signs (an aura). An aura may include:  Seeing flashing lights or having blind spots.  Seeing bright spots, halos, or zigzag lines.  Having tunnel vision or blurred vision.  Having numbness or a tingling feeling.  Having trouble talking.  Having muscle weakness. Some people have symptoms after a migraine headache (postdromal phase), such as:  Feeling tired.  Difficulty concentrating. How is this diagnosed? A migraine headache can be diagnosed based on:  Your symptoms.  A physical exam.  Tests, such as: ? CT scan or an MRI of the head. These imaging tests can help rule out other causes of headaches. ? Taking fluid from the spine (lumbar puncture) and analyzing it (cerebrospinal fluid analysis, or CSF analysis). How is this treated? This condition may be treated with medicines that:  Relieve pain.  Relieve nausea.  Prevent migraine headaches. Treatment for this condition may also include:  Acupuncture.  Lifestyle changes like avoiding foods that trigger migraine headaches.  Biofeedback.  Cognitive behavioral therapy. Follow these instructions at home: Medicines  Take over-the-counter and prescription medicines only as told by your health care provider.  Ask your health care provider if the medicine prescribed to you: ? Requires you to avoid driving or using heavy machinery. ? Can cause constipation. You may need to take these actions to prevent or treat constipation:  Drink enough fluid to keep your urine pale yellow.  Take over-the-counter or prescription medicines.  Eat foods that are high in fiber, such as beans, whole grains, and fresh fruits and vegetables.  Limit foods that are high in fat and processed sugars, such as fried or sweet foods. Lifestyle  Do not drink alcohol.  Do  not use any products that contain nicotine or tobacco, such as cigarettes, e-cigarettes, and chewing tobacco. If you need help quitting, ask your health care provider.  Get at least 8 hours of sleep every night.  Find ways to manage stress, such as meditation, deep breathing, or yoga. General instructions      Keep a journal to find out what may trigger your migraine headaches. For example, write down: ? What you eat and drink. ? How much sleep you get. ? Any change to your diet or medicines.  If you have a migraine headache: ? Avoid things that make your symptoms worse, such as bright lights. ? It may help to lie down in a dark, quiet room. ? Do not drive or use heavy machinery. ? Ask your health care provider what activities are safe for you while you are experiencing symptoms.  Keep all follow-up visits as told by your health care provider. This is important. Contact a health care provider if:  You develop symptoms that are different or more severe than your usual migraine headache symptoms.  You have more than 15 headache days in one month. Get help right away if:  Your migraine headache becomes severe.  Your migraine headache lasts longer than 72 hours.  You have a fever.  You have a stiff neck.  You have vision loss.  Your muscles feel weak or like you cannot control them.  You start to lose your balance often.  You have trouble walking.  You faint.  You have a seizure. Summary  A migraine headache is an intense, throbbing pain on one side or both sides of the head. Migraines may also cause other symptoms, such as nausea, vomiting, and sensitivity to light and noise.  This condition may be treated with medicines and lifestyle changes. You may also need to avoid certain things that trigger a migraine headache.  Keep a journal to find out what may trigger your migraine headaches.  Contact your health care provider if you have more than 15 headache days in a  month or you develop symptoms that are different or more severe than your usual migraine headache symptoms. This information is not intended to replace advice given to you by your health care provider. Make sure you discuss any questions you have with your health care provider. Document Revised: 06/20/2018 Document Reviewed: 04/10/2018 Elsevier Patient Education  [place].  Ketorolac tablets What is this medicine? KETOROLAC (kee toe ROLE ak) is a non-steroidal anti-inflammatory drug (NSAID). It is used for a short while to treat moderate to severe pain, including pain after surgery. It should not be used for more than 5 days. This medicine may be used for other purposes; ask your health care provider or pharmacist if you have questions. COMMON BRAND NAME(S): Toradol What should I tell my health care provider before I take this medicine? They need to know if you have any of these conditions:  bleeding disorders  cigarette smoker  coronary artery bypass graft (CABG) surgery within the past 2 weeks  drink more than 3 alcohol-containing drinks a day  heart disease  high blood pressure  history of stomach bleeding  kidney disease  liver disease  lung or breathing disease, like asthma  stomach or intestine problems  an unusual or allergic reaction to ketorolac, aspirin, other NSAIDs, other medicines, foods, dyes, or preservatives  pregnant or trying to get pregnant  breast-feeding How should I use this medicine? Take this medicine by mouth with a full glass of water. Follow the directions on the prescription label. Take your medicine at regular intervals. Do not take your medicine more often than directed. Do not take more than the recommended dose. A special MedGuide will be given to you by the pharmacist with each prescription and refill. Be sure to read this information carefully each time. Talk to your pediatrician regarding the use of this medicine in children.  While this drug may be prescribed for children as young as 84 years of age for selected conditions, precautions do apply. Patients over 20 years old may have a stronger reaction and need a smaller dose. Overdosage: If you think you have taken too much of this medicine contact a poison control center or emergency room at once. NOTE: This medicine is only for you. Do not share this medicine with others. What if I miss a dose? If you miss a dose, take it as soon as you can. If it is almost time for your next dose, take only that dose. Do not take double or extra doses. What may interact with this medicine? Do not take this medicine with any of the following medications:  aspirin and  aspirin-like medicines  cidofovir  methotrexate  NSAIDs, medicines for pain and inflammation, like ibuprofen or naproxen  pemetrexed  probenecid This medicine may also interact with the following medications:  alcohol  alendronate  alprazolam  carbamazepine  cyclosporine  diuretics  flavocoxid  fluoxetine  ginkgo  lithium  medicines for high blood pressure like enalapril  medicines that affect platelets like pentoxifylline  medicines that treat or prevent blood clots like heparin, warfarin  muscle relaxants  phenytoin  steroid medicines like prednisone or cortisone  thiothixene This list may not describe all possible interactions. Give your health care provider a list of all the medicines, herbs, non-prescription drugs, or dietary supplements you use. Also tell them if you smoke, drink alcohol, or use illegal drugs. Some items may interact with your medicine. What should I watch for while using this medicine? Tell your doctor or healthcare provider if your pain does not get better. Talk to your doctor before taking another medicine for pain. Do not treat yourself. This medicine may cause serious skin reactions. They can happen weeks to months after starting the medicine. Contact your  healthcare provider right away if you notice fevers or flu-like symptoms with a rash. The rash may be red or purple and then turn into blisters or peeling of the skin. Or, you might notice a red rash with swelling of the face, lips or lymph nodes in your neck or under your arms. This medicine does not prevent heart attack or stroke. In fact, this medicine may increase the chance of a heart attack or stroke. The chance may increase with longer use of this medicine and in people who have heart disease. If you take aspirin to prevent heart attack or stroke, talk with your doctor or healthcare provider. Do not take medicines such as ibuprofen and naproxen with this medicine. Side effects such as stomach upset, nausea, or ulcers may be more likely to occur. Many medicines available without a prescription should not be taken with this medicine. This medicine can cause ulcers and bleeding in the stomach and intestines at any time during treatment. Do not smoke cigarettes or drink alcohol. These increase irritation to your stomach and can make it more susceptible to damage from this medicine. Ulcers and bleeding can happen without warning symptoms and can cause death. You may get drowsy or dizzy. Do not drive, use machinery, or do anything that needs mental alertness until you know how this medicine affects you. Do not stand or sit up quickly, especially if you are an older patient. This reduces the risk of dizzy or fainting spells. This medicine can cause you to bleed more easily. Try to avoid damage to your teeth and gums when you brush or floss your teeth. What side effects may I notice from receiving this medicine? Side effects that you should report to your doctor or health care professional as soon as possible:  allergic reactions like skin rash, itching or hives, swelling of the face, lips, or tongue  breathing problems  high blood pressure  nausea, vomiting  redness, blistering, peeling or loosening  of the skin, including inside the mouth  severe stomach pain  signs and symptoms of bleeding such as bloody or black, tarry stools; red or dark-brown urine; spitting up blood or brown material that looks like coffee grounds; red spots on the skin; unusual bruising or bleeding from the eye, gums, or nose  signs and symptoms of a stroke like changes in vision; confusion; trouble speaking or understanding;  severe headaches; sudden numbness or weakness of the face, arm or leg; trouble walking; dizziness; loss of balance or coordination  trouble passing urine or change in the amount of urine  unexplained weight gain or swelling  unusually weak or tired  yellowing of eyes or skin Side effects that usually do not require medical attention (report to your doctor or health care professional if they continue or are bothersome):  diarrhea  dizziness  headache  heartburn This list may not describe all possible side effects. Call your doctor for medical advice about side effects. You may report side effects to FDA at 1-800-FDA-1088. Where should I keep my medicine? Keep out of the reach of children. Store at room temperature between 20 and 25 degrees C (68 and 77 degrees F). Throw away any unused medicine after the expiration date. NOTE: This sheet is a summary. It may not cover all possible information. If you have questions about this medicine, talk to your doctor, pharmacist, or health care provider.  2020 Elsevier/Gold Standard (2018-05-14 15:15:50)
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
## 2019-03-18 NOTE — Addendum Note (Signed)
Addended by: Sarina Ill B on: 03/18/2019 08:22 AM   Modules accepted: Orders

## 2019-03-20 NOTE — Progress Notes (Signed)
I used to see this patient, I am not her PCP any more. It has to be sent to her PCP

## 2019-04-16 ENCOUNTER — Ambulatory Visit: Payer: Medicaid Other | Attending: Otolaryngology | Admitting: Physical Therapy

## 2019-04-16 ENCOUNTER — Encounter: Payer: Self-pay | Admitting: Physical Therapy

## 2019-04-16 ENCOUNTER — Other Ambulatory Visit: Payer: Self-pay

## 2019-04-16 VITALS — BP 100/70

## 2019-04-16 DIAGNOSIS — M542 Cervicalgia: Secondary | ICD-10-CM | POA: Diagnosis not present

## 2019-04-16 DIAGNOSIS — R42 Dizziness and giddiness: Secondary | ICD-10-CM | POA: Diagnosis present

## 2019-04-16 NOTE — Patient Instructions (Signed)
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
## 2019-04-30 ENCOUNTER — Ambulatory Visit: Payer: Medicaid Other | Admitting: Physical Therapy

## 2019-05-04 ENCOUNTER — Ambulatory Visit: Payer: Medicaid Other | Admitting: Rehabilitation

## 2019-05-04 ENCOUNTER — Other Ambulatory Visit: Payer: Self-pay

## 2019-05-04 ENCOUNTER — Encounter: Payer: Self-pay | Admitting: Rehabilitation

## 2019-05-04 DIAGNOSIS — M542 Cervicalgia: Secondary | ICD-10-CM

## 2019-05-04 NOTE — Patient Instructions (Signed)
Access Code: [email or web address]  URL: [email or web address]  Date: 05/04/2019  Prepared by: Cameron Sprang   Exercises Seated Shoulder Rolls - 10 reps - 1 sets - 2-3x daily - 7x weekly Seated Upper Trapezius Stretch - 2-3 reps - 1 sets - 20-30 secs hold - 2-3x daily - 7x weekly Supine Cervical Rotation AROM on Pillow - 10 reps - 1 sets - 2-3x daily - 7x weekly Supine Chin Tuck - 10 reps - 1 sets - 5 hold - 2-3x daily - 7x weekly                  Seated Assisted Cervical Rotation with Towel - 10 reps - 1 sets - 2-3x daily - 7x weekly
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
## 2019-05-14 ENCOUNTER — Ambulatory Visit: Payer: Medicaid Other | Attending: Otolaryngology | Admitting: Physical Therapy

## 2019-05-14 ENCOUNTER — Encounter: Payer: Self-pay | Admitting: Physical Therapy

## 2019-05-14 ENCOUNTER — Other Ambulatory Visit: Payer: Self-pay

## 2019-05-14 VITALS — BP 110/80

## 2019-05-14 DIAGNOSIS — M542 Cervicalgia: Secondary | ICD-10-CM | POA: Diagnosis present

## 2019-05-14 DIAGNOSIS — R42 Dizziness and giddiness: Secondary | ICD-10-CM
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
## 2019-05-14 NOTE — Patient Instructions (Signed)
Access Code: [email or web address] URL: [email or web address] Date: 05/14/2019 Prepared by: Misty Stanley  Exercises Seated Shoulder Rolls - 10 reps - 1 sets - 2-3x daily - 7x weekly Seated Upper Trapezius Stretch - 2-3 reps - 1 sets - 20-30 secs hold - 2-3x daily - 7x weekly Supine Cervical Rotation AROM on Pillow - 10 reps - 1 sets - 2-3x daily - 7x weekly Supine Chin Tuck - 10 reps - 1 sets - 5 hold - 2-3x daily - 7x weekly Seated Assisted Cervical Rotation with Towel - 10 reps - 1 sets - 2-3x daily - 7x weekly Standing Shoulder External Rotation Stretch in Doorway - 2 sets - 20 second hold - 1x daily - 7x weeklyweekly

## 2019-05-19 ENCOUNTER — Telehealth: Payer: Self-pay | Admitting: Family Medicine

## 2019-05-19 NOTE — Telephone Encounter (Signed)
Pt is asking for a call from RN to discuss the daily migraines she has, please call

## 2019-05-19 NOTE — Telephone Encounter (Signed)
Will have to wait until opening.

## 2019-05-19 NOTE — Telephone Encounter (Signed)
I called pt and she is having daily headaches since about a week after being in here 03-18-19 with Dr. Jaynee Eagles.  She has taken maxalt she stated about 2 times, causes nausea, taking motrin and toradol more, with pepcid (hx of stomache ulcers). I mentioned rebound headaches. Also c/o dizziness at times.  Appt with AL/NP is May 2021.  No availability with AL/NP sooner unless cancellation, attempted appt this afternoon with other NP but she was going to [place].  Any other reccs?

## 2019-05-19 NOTE — Telephone Encounter (Signed)
When I saw her she was having migraines episodically, sounds like they are getting worse. I would schedule her with AMy or Megan to discuss preventative medications, something sooner rather than later, I know amy and megan have opening frequently.

## 2019-05-20 ENCOUNTER — Other Ambulatory Visit: Payer: Self-pay

## 2019-05-20 ENCOUNTER — Encounter: Payer: Self-pay | Admitting: Physical Therapy

## 2019-05-20 ENCOUNTER — Encounter: Payer: Self-pay | Admitting: Adult Health

## 2019-05-20 ENCOUNTER — Ambulatory Visit: Payer: Medicaid Other | Admitting: Physical Therapy

## 2019-05-20 ENCOUNTER — Ambulatory Visit: Payer: Medicaid Other | Admitting: Adult Health

## 2019-05-20 VITALS — BP 129/90 | HR 70 | Temp 98.0°F | Ht 61.0 in | Wt 236.0 lb

## 2019-05-20 DIAGNOSIS — G43711 Chronic migraine without aura, intractable, with status migrainosus: Secondary | ICD-10-CM | POA: Diagnosis not present

## 2019-05-20 DIAGNOSIS — M542 Cervicalgia: Secondary | ICD-10-CM | POA: Diagnosis not present

## 2019-05-20 DIAGNOSIS — R42 Dizziness and giddiness: Secondary | ICD-10-CM

## 2019-05-20 MED ORDER — NORTRIPTYLINE HCL 10 MG PO CAPS: 10.0000 mg | ORAL_CAPSULE | Freq: Every day | ORAL | 3 refills | Status: DC

## 2019-05-20 MED ORDER — PREDNISONE 5 MG PO TABS: ORAL_TABLET | ORAL | 0 refills | Status: DC

## 2019-05-20 NOTE — Patient Instructions (Addendum)
Your Plan:  Start Nortriptyline 10 mg at bedtime Continue Maxalt  Avoid taking OTC medication or Toradol on a daily basis.   Thank you for coming to see [place] at [facility]. I hope we have been able to provide you high quality care today.  You may receive a patient satisfaction survey over the next few weeks. We would appreciate your feedback and comments so that we may continue to improve ourselves and the health of our patients.  Nortriptyline capsules What is this medicine? NORTRIPTYLINE (nor TRIP ti leen) is used to treat depression. This medicine may be used for other purposes; ask your health care provider or pharmacist if you have questions. COMMON BRAND NAME(S): Aventyl, Pamelor What should I tell my health care provider before I take this medicine? They need to know if you have any of these conditions:  bipolar disorder  Brugada syndrome  difficulty passing urine  glaucoma  heart disease  if you drink alcohol  liver disease  schizophrenia  seizures  suicidal thoughts, plans or attempt; a previous suicide attempt by you or a family member  thyroid disease  an unusual or allergic reaction to nortriptyline, other tricyclic antidepressants, other medicines, foods, dyes, or preservatives  pregnant or trying to get pregnant  breast-feeding How should I use this medicine? Take this medicine by mouth with a glass of water. Follow the directions on the prescription label. Take your doses at regular intervals. Do not take it more often than directed. Do not stop taking this medicine suddenly except upon the advice of your doctor. Stopping this medicine too quickly may cause serious side effects or your condition may worsen. A special MedGuide will be given to you by the pharmacist with each prescription and refill. Be sure to read this information carefully each time. Talk to your pediatrician regarding the use of this medicine in children. Special care  may be needed. Overdosage: If you think you have taken too much of this medicine contact a poison control center or emergency room at once. NOTE: This medicine is only for you. Do not share this medicine with others. What if I miss a dose? If you miss a dose, take it as soon as you can. If it is almost time for your next dose, take only that dose. Do not take double or extra doses. What may interact with this medicine? Do not take this medicine with any of the following medications:  cisapride  dronedarone  linezolid  MAOIs like Carbex, Eldepryl, Marplan, Nardil, and Parnate  methylene blue (injected into a vein)  pimozide  thioridazine This medicine may also interact with the following medications:  alcohol  antihistamines for allergy, cough, and cold  atropine  certain medicines for bladder problems like oxybutynin, tolterodine  certain medicines for depression like amitriptyline, fluoxetine, sertraline  certain medicines for Parkinson's disease like benztropine, trihexyphenidyl  certain medicines for stomach problems like dicyclomine, hyoscyamine  certain medicines for travel sickness like scopolamine  chlorpropamide  cimetidine  ipratropium  other medicines that prolong the QT interval (an abnormal heart rhythm) like dofetilide  other medicines that can cause serotonin syndrome like St. John's Wort, fentanyl, lithium, tramadol, tryptophan, buspirone, and some medicines for headaches like sumatriptan or rizatriptan  quinidine  reserpine  thyroid medicine This list may not describe all possible interactions. Give your health care provider a list of all the medicines, herbs, non-prescription drugs, or dietary supplements you use. Also tell them if you smoke, drink alcohol, or use illegal  drugs. Some items may interact with your medicine. What should I watch for while using this medicine? Tell your doctor if your symptoms do not get better or if they get worse.  Visit your doctor or health care professional for regular checks on your progress. Because it may take several weeks to see the full effects of this medicine, it is important to continue your treatment as prescribed by your doctor. Patients and their families should watch out for new or worsening thoughts of suicide or depression. Also watch out for sudden changes in feelings such as feeling anxious, agitated, panicky, irritable, hostile, aggressive, impulsive, severely restless, overly excited and hyperactive, or not being able to sleep. If this happens, especially at the beginning of treatment or after a change in dose, call your health care professional. Dennis Bast may get drowsy or dizzy. Do not drive, use machinery, or do anything that needs mental alertness until you know how this medicine affects you. Do not stand or sit up quickly, especially if you are an older patient. This reduces the risk of dizzy or fainting spells. Alcohol may interfere with the effect of this medicine. Avoid alcoholic drinks. Do not treat yourself for coughs, colds, or allergies without asking your doctor or health care professional for advice. Some ingredients can increase possible side effects. Your mouth may get dry. Chewing sugarless gum or sucking hard candy, and drinking plenty of water may help. Contact your doctor if the problem does not go away or is severe. This medicine may cause dry eyes and blurred vision. If you wear contact lenses you may feel some discomfort. Lubricating drops may help. See your eye doctor if the problem does not go away or is severe. This medicine can cause constipation. Try to have a bowel movement at least every 2 to 3 days. If you do not have a bowel movement for 3 days, call your doctor or health care professional. This medicine can make you more sensitive to the sun. Keep out of the sun. If you cannot avoid being in the sun, wear protective clothing and use sunscreen. Do not use sun lamps or  tanning beds/booths. What side effects may I notice from receiving this medicine? Side effects that you should report to your doctor or health care professional as soon as possible:  allergic reactions like skin rash, itching or hives, swelling of the face, lips, or tongue  anxious  breathing problems  changes in vision  confusion  elevated mood, decreased need for sleep, racing thoughts, impulsive behavior  eye pain  fast, irregular heartbeat  feeling faint or lightheaded, falls  feeling agitated, angry, or irritable  fever with increased sweating  hallucination, loss of contact with reality  seizures  stiff muscles  suicidal thoughts or other mood changes  tingling, pain, or numbness in the feet or hands  trouble passing urine or change in the amount of urine  trouble sleeping  unusually weak or tired  vomiting  yellowing of the eyes or skin Side effects that usually do not require medical attention (report to your doctor or health care professional if they continue or are bothersome):  change in sex drive or performance  change in appetite or weight  constipation  dizziness  dry mouth  nausea  tired  tremors  upset stomach This list may not describe all possible side effects. Call your doctor for medical advice about side effects. You may report side effects to FDA at 1-800-FDA-1088. Where should I keep my medicine? Keep  out of the reach of children. Store at room temperature between 15 and 30 degrees C (59 and 86 degrees F). Keep container tightly closed. Throw away any unused medicine after the expiration date. NOTE: This sheet is a summary. It may not cover all possible information. If you have questions about this medicine, talk to your doctor, pharmacist, or health care provider.  2020 Elsevier/Gold Standard (2018-02-18 13:24:58)
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
## 2019-05-20 NOTE — Patient Instructions (Signed)
Access Code: [email or web address] URL: [email or web address] Date: 05/20/2019 Prepared by: Misty Stanley  Exercises Seated Shoulder Rolls - 10 reps - 1 sets - 2-3x daily - 7x weekly Seated Upper Trapezius Stretch - 2-3 reps - 1 sets - 20-30 secs hold - 2-3x daily - 7x weekly Seated Levator Scapulae Stretch - 3 sets - 20 second hold - 2-3x daily - 7x weekly Seated Assisted Cervical Rotation with Towel - 10 reps - 1 sets - 2-3x daily - 7x weekly Standing Shoulder External Rotation Stretch in Doorway - 2 sets - 20 second hold - 1x daily - 7x weekly Supine Chin Tuck - 10 reps - 1 sets - 5 hold - 2-3x daily - 7x weekly Cat Cow - 10 reps - 1 sets - 1x daily - 7x weekly Child's Pose with Sidebending - 3 sets - 20 second hold - 1x daily - 7x weekly
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
## 2019-06-01 ENCOUNTER — Ambulatory Visit: Payer: Medicaid Other | Admitting: Rehabilitation

## 2019-06-11 ENCOUNTER — Other Ambulatory Visit: Payer: Self-pay

## 2019-06-11 ENCOUNTER — Encounter: Payer: Self-pay | Admitting: Rehabilitation

## 2019-06-11 ENCOUNTER — Ambulatory Visit: Payer: Medicaid Other | Attending: Otolaryngology | Admitting: Rehabilitation

## 2019-06-11 DIAGNOSIS — R293 Abnormal posture: Secondary | ICD-10-CM | POA: Diagnosis present

## 2019-06-11 DIAGNOSIS — M25521 Pain in right elbow: Secondary | ICD-10-CM | POA: Insufficient documentation

## 2019-06-11 DIAGNOSIS — R42 Dizziness and giddiness: Secondary | ICD-10-CM | POA: Diagnosis present

## 2019-06-11 DIAGNOSIS — M542 Cervicalgia: Secondary | ICD-10-CM | POA: Diagnosis present
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
## 2019-06-11 NOTE — Patient Instructions (Signed)
Access Code: [email or web address] URL: [email or web address] Date: 06/11/2019 Prepared by: Cameron Sprang  Exercises Seated Upper Trapezius Stretch - 2-3 x daily - 7 x weekly - 2-3 reps - 1 sets - 20-30 secs hold Seated Levator Scapulae Stretch - 2-3 x daily - 7 x weekly - 3 sets - 20 second hold Seated Assisted Cervical Rotation with Towel - 2-3 x daily - 7 x weekly - 10 reps - 1 sets Standing Shoulder External Rotation Stretch in Doorway - 1 x daily - 7 x weekly - 2 sets - 20 second hold Supine Chin Tuck - 2-3 x daily - 7 x weekly - 10 reps - 1 sets - 5 hold Cat Cow - 1 x daily - 7 x weekly - 10 reps - 1 sets Child's Pose with Sidebending - 1 x daily - 7 x weekly - 3 sets - 20 second hold Seated Shoulder Extension and Scapular Retraction with Resistance - 1 x daily - 7 x weekly - 2 sets - 10 reps Supine 90/90 Alternating Heel Touches with Posterior Pelvic Tilt - 1 x daily - 7 x weekly - 2 sets - 10 reps Supine March with Posterior Pelvic Tilt - 1 x daily - 7 x weekly - 10 reps - 3 sets Standard Plank - 1 x daily - 7 x weekly - 2 sets - 5 reps - 5 hold

## 2019-06-18 ENCOUNTER — Ambulatory Visit: Payer: Medicaid Other | Admitting: Rehabilitation

## 2019-06-18 ENCOUNTER — Telehealth: Payer: Self-pay | Admitting: Rehabilitation

## 2019-06-18 ENCOUNTER — Other Ambulatory Visit: Payer: Self-pay

## 2019-06-18 ENCOUNTER — Encounter: Payer: Self-pay | Admitting: Rehabilitation

## 2019-06-18 DIAGNOSIS — M542 Cervicalgia: Secondary | ICD-10-CM | POA: Diagnosis not present

## 2019-06-18 DIAGNOSIS — M25521 Pain in right elbow: Secondary | ICD-10-CM

## 2019-06-18 DIAGNOSIS — R293 Abnormal posture: Secondary | ICD-10-CM

## 2019-06-18 NOTE — Telephone Encounter (Signed)
Rebecca Blackburn,   We are seeing Rebecca Blackburn here at [facility] for cervicalgia and abnormal posture.  We received your order to address epicondylitis.  We are going to address with iontophoresis.  Will you please write an order for this in work que or fax back to [place] so we can use this intervention.  Also please include new order for OT as if this issue is ongoing I believe she would be better suited being treated by OT for this issue.    Thanks,  Cameron Sprang, PT, MPT [facility] [address] [place], [place], [address] Phone: [phone]   Fax:  [phone] 06/18/19, 6:55 PM
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
## 2019-06-21 ENCOUNTER — Other Ambulatory Visit: Payer: Self-pay

## 2019-06-21 ENCOUNTER — Encounter (HOSPITAL_BASED_OUTPATIENT_CLINIC_OR_DEPARTMENT_OTHER): Payer: Self-pay | Admitting: Emergency Medicine

## 2019-06-21 ENCOUNTER — Emergency Department (HOSPITAL_BASED_OUTPATIENT_CLINIC_OR_DEPARTMENT_OTHER)
Admission: EM | Admit: 2019-06-21 | Discharge: 2019-06-21 | Disposition: A | Discharge status: Home / Self Care | Payer: Medicaid Other | Attending: Emergency Medicine | Admitting: Emergency Medicine

## 2019-06-21 DIAGNOSIS — I10 Essential (primary) hypertension: Secondary | ICD-10-CM | POA: Diagnosis not present

## 2019-06-21 DIAGNOSIS — Z79899 Other long term (current) drug therapy: Secondary | ICD-10-CM | POA: Diagnosis not present

## 2019-06-21 DIAGNOSIS — N760 Acute vaginitis: Secondary | ICD-10-CM | POA: Insufficient documentation

## 2019-06-21 DIAGNOSIS — N899 Noninflammatory disorder of vagina, unspecified: Secondary | ICD-10-CM | POA: Diagnosis present

## 2019-06-21 DIAGNOSIS — J45909 Unspecified asthma, uncomplicated: Secondary | ICD-10-CM | POA: Diagnosis not present

## 2019-06-21 DIAGNOSIS — B9689 Other specified bacterial agents as the cause of diseases classified elsewhere: Secondary | ICD-10-CM

## 2019-06-21 LAB — URINALYSIS, ROUTINE W REFLEX MICROSCOPIC
Bilirubin Urine: NEGATIVE
Glucose, UA: NEGATIVE mg/dL
Ketones, ur: NEGATIVE mg/dL
Leukocytes,Ua: NEGATIVE
Nitrite: NEGATIVE
Protein, ur: NEGATIVE mg/dL
Specific Gravity, Urine: >1.03 — ABNORMAL HIGH (ref 1.005–1.030)
pH: 5.5 (ref 5.0–8.0)

## 2019-06-21 LAB — URINALYSIS, MICROSCOPIC (REFLEX)

## 2019-06-21 LAB — PREGNANCY, URINE: Preg Test, Ur: NEGATIVE

## 2019-06-21 LAB — WET PREP, GENITAL
Sperm: NONE SEEN
Trich, Wet Prep: NONE SEEN
WBC, Wet Prep HPF POC: NONE SEEN
Yeast Wet Prep HPF POC: NONE SEEN

## 2019-06-21 MED ORDER — METRONIDAZOLE 500 MG PO TABS: 500.0000 mg | ORAL_TABLET | Freq: Two times a day (BID) | ORAL | 0 refills | Status: DC

## 2019-06-21 MED ORDER — METRONIDAZOLE 500 MG PO TABS
500.0000 mg | ORAL_TABLET | Freq: Once | ORAL | Status: AC
  Administered 2019-06-21: 01:00:00 500 mg via ORAL
  Filled 2019-06-21: qty 1

## 2019-06-21 MED ORDER — TIOCONAZOLE 6.5 % VA OINT: 1.0000 | TOPICAL_OINTMENT | Freq: Once | VAGINAL | 0 refills | Status: AC

## 2019-06-21 NOTE — ED Triage Notes (Signed)
Pt reports vginal itching and discharge for three days. Unsure if she has odor - had COVID in September and still cannot smell. Denies urinary symptoms.

## 2019-06-21 NOTE — ED Provider Notes (Signed)
[facility] EMERGENCY DEPARTMENT Provider Note   CSN: [id number] Arrival date & time: 06/21/19  0013     History Chief Complaint  Patient presents with  . Vaginal Itching    Rebecca Dugo is a 34 y.o. female.  Patient presents to the emergency department for evaluation of vaginal itching and irritation.  Symptoms present for several days.  She has had some discharge.  No pelvic pain.        Past Medical History:  Diagnosis Date  . Asthma   . High blood pressure   . Migraine   . Varicella     Patient Active Problem List   Diagnosis Date Noted  . Migraine with aura and without status migrainosus, not intractable 03/18/2019    Past Surgical History:  Procedure Laterality Date  . CESAREAN SECTION     2016 and 2006  . SKIN BIOPSY    . TONSILLECTOMY       OB History   No obstetric history on file.     Family History  Problem Relation Age of Onset  . Hypertension Mother   . Thyroid disease Father   . Diabetes Maternal Grandmother   . Diabetes Maternal Grandfather   . Migraines Neg Hx     Social History   Tobacco Use  . Smoking status: Never Smoker  . Smokeless tobacco: Never Used  Substance Use Topics  . Alcohol use: Yes    Comment: occasional  . Drug use: Never    Home Medications Prior to Admission medications   Medication Sig Start Date End Date Taking? Authorizing Provider  acyclovir (ZOVIRAX) 400 MG tablet Take 400 mg by mouth 2 (two) times daily.    [provider]  albuterol (PROAIR HFA) 108 (90 Base) MCG/ACT inhaler INHALE 2 PUFFS 3 TIMES A DAY 05/19/16   [provider]  Biotin 1 MG CAPS Take by mouth.    [provider]  cyclobenzaprine (FLEXERIL) 10 MG tablet Take by mouth as needed.  09/11/18 09/11/19  [provider]  ketorolac (TORADOL) 10 MG tablet Take 1 tablet (10 mg total) by mouth every 6 (six) hours as needed. 03/18/19   Melvenia Beam, MD  losartan (COZAAR) 100 MG tablet Take 50 mg by  mouth daily.  09/15/18   [provider]  metroNIDAZOLE (FLAGYL) 500 MG tablet Take 1 tablet (500 mg total) by mouth 2 (two) times daily. One po bid x 7 days 06/21/19   Orpah Greek, MD  Multiple Vitamin (MULTIVITAMIN) capsule Take 1 capsule by mouth daily.    [provider]  nortriptyline (PAMELOR) 10 MG capsule Take 1 capsule (10 mg total) by mouth at bedtime. 05/20/19   Ward Givens, NP  predniSONE (DELTASONE) 5 MG tablet Begin taking 6 tablets daily, taper by one tablet daily until off the medication. 05/20/19   Ward Givens, NP  rizatriptan (MAXALT-MLT) 10 MG disintegrating tablet Take 1 tablet (10 mg total) by mouth as needed for migraine. May repeat in 2 hours if needed 03/18/19   Melvenia Beam, MD    Allergies    Amoxicillin, Cefaclor, and Cefuroxime axetil  Review of Systems   Review of Systems  Genitourinary: Positive for vaginal discharge.  All other systems reviewed and are negative.   Physical Exam Updated Vital Signs BP 126/77 (BP Location: Right Arm)   Pulse 88   Temp 98.2 F (36.8 C) (Oral)   Resp 18   Wt 108.4 kg   LMP 06/11/2019 (Approximate)  SpO2 95%   BMI 45.16 kg/m   Physical Exam Vitals and nursing note reviewed.  Constitutional:      General: She is not in acute distress.    Appearance: Normal appearance. She is well-developed.  HENT:     Head: Normocephalic and atraumatic.     Right Ear: Hearing normal.     Left Ear: Hearing normal.     Nose: Nose normal.  Eyes:     Conjunctiva/sclera: Conjunctivae normal.     Pupils: Pupils are equal, round, and reactive to light.  Cardiovascular:     Rate and Rhythm: Regular rhythm.     Heart sounds: S1 normal and S2 normal. No murmur. No friction rub. No gallop.   Pulmonary:     Effort: Pulmonary effort is normal. No respiratory distress.     Breath sounds: Normal breath sounds.  Chest:     Chest wall: No tenderness.  Abdominal:     General: Bowel sounds are normal.      Palpations: Abdomen is soft.     Tenderness: There is no abdominal tenderness. There is no guarding or rebound. Negative signs include Murphy's sign and McBurney's sign.     Hernia: No hernia is present.  Musculoskeletal:        General: Normal range of motion.     Cervical back: Normal range of motion and neck supple.  Skin:    General: Skin is warm and dry.     Findings: No rash.  Neurological:     Mental Status: She is alert and oriented to person, place, and time.     GCS: GCS eye subscore is 4. GCS verbal subscore is 5. GCS motor subscore is 6.     Cranial Nerves: No cranial nerve deficit.     Sensory: No sensory deficit.     Coordination: Coordination normal.  Psychiatric:        Speech: Speech normal.        Behavior: Behavior normal.        Thought Content: Thought content normal.     ED Results / Procedures / Treatments   Labs (all labs ordered are listed, but only abnormal results are displayed) Labs Reviewed  WET PREP, GENITAL - Abnormal; Notable for the following components:      Result Value   Clue Cells Wet Prep HPF POC PRESENT (*)    All other components within normal limits  URINALYSIS, ROUTINE W REFLEX MICROSCOPIC - Abnormal; Notable for the following components:   APPearance HAZY (*)    Specific Gravity, Urine >1.030 (*)    Hgb urine dipstick TRACE (*)    All other components within normal limits  PREGNANCY, URINE  URINALYSIS, MICROSCOPIC (REFLEX)  GC/CHLAMYDIA PROBE AMP ([place]) NOT AT [facility]    EKG None  Radiology No results found.  Procedures Procedures (including critical care time)  Medications Ordered in ED Medications  metroNIDAZOLE (FLAGYL) tablet 500 mg (has no administration in time range)    ED Course  I have reviewed the triage vital signs and the nursing notes.  Pertinent labs & imaging results that were available during my care of the patient were reviewed by me and considered in my medical decision making (see chart for  details).    MDM Rules/Calculators/A&P                      Clue cells present on wet prep, otherwise work-up unremarkable.  Treat for vaginosis.  Final Clinical Impression(s) /  ED Diagnoses Final diagnoses:  BV (bacterial vaginosis)    Rx / DC Orders ED Discharge Orders         Ordered    metroNIDAZOLE (FLAGYL) 500 MG tablet  2 times daily     06/21/19 0051           Orpah Greek, MD 06/21/19 [phone]

## 2019-06-22 LAB — GC/CHLAMYDIA PROBE AMP (~~LOC~~) NOT AT ARMC
Chlamydia: NEGATIVE
Comment: NEGATIVE
Comment: NORMAL
Neisseria Gonorrhea: NEGATIVE

## 2019-06-25 ENCOUNTER — Ambulatory Visit: Payer: Medicaid Other | Admitting: Physical Therapy

## 2019-06-25 ENCOUNTER — Other Ambulatory Visit: Payer: Self-pay

## 2019-06-25 DIAGNOSIS — M542 Cervicalgia: Secondary | ICD-10-CM

## 2019-06-25 DIAGNOSIS — R293 Abnormal posture: Secondary | ICD-10-CM

## 2019-06-25 DIAGNOSIS — R42 Dizziness and giddiness: Secondary | ICD-10-CM

## 2019-06-25 DIAGNOSIS — M25521 Pain in right elbow: Secondary | ICD-10-CM
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
## 2019-07-01 ENCOUNTER — Ambulatory Visit: Payer: Medicaid Other | Admitting: Occupational Therapy

## 2019-07-02 ENCOUNTER — Encounter: Payer: Self-pay | Admitting: Physical Therapy

## 2019-07-02 ENCOUNTER — Ambulatory Visit: Payer: Medicaid Other | Admitting: Physical Therapy

## 2019-07-02 ENCOUNTER — Other Ambulatory Visit: Payer: Self-pay

## 2019-07-02 DIAGNOSIS — R293 Abnormal posture: Secondary | ICD-10-CM

## 2019-07-02 DIAGNOSIS — M542 Cervicalgia: Secondary | ICD-10-CM | POA: Diagnosis not present
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
## 2019-07-08 ENCOUNTER — Other Ambulatory Visit: Payer: Self-pay

## 2019-07-08 ENCOUNTER — Encounter: Payer: Self-pay | Admitting: Physical Therapy

## 2019-07-08 ENCOUNTER — Ambulatory Visit: Payer: Medicaid Other | Admitting: Physical Therapy

## 2019-07-08 DIAGNOSIS — M542 Cervicalgia: Secondary | ICD-10-CM | POA: Diagnosis not present

## 2019-07-08 DIAGNOSIS — M25521 Pain in right elbow: Secondary | ICD-10-CM

## 2019-07-08 NOTE — Patient Instructions (Addendum)
Access Code: [email or web address] URL: [email or web address] Date: 07/08/2019 Prepared by: Misty Stanley  Exercises Seated Wrist Flexion with Overpressure - 1 x daily - 7 x weekly - 5 reps - 30 seconds hold Wrist Flexion Active Stretch in Neutral - 1 x daily - 7 x weekly - 5 reps - 30 seconds hold Seated Isometric Wrist Flexion Supinated with Manual Resistance - 1 x daily - 7 x weekly - 10 reps - 1 sets - 6 second hold
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
## 2019-07-15 ENCOUNTER — Ambulatory Visit: Payer: Medicaid Other | Attending: Otolaryngology | Admitting: Physical Therapy

## 2019-07-15 ENCOUNTER — Other Ambulatory Visit: Payer: Self-pay

## 2019-07-15 ENCOUNTER — Encounter: Payer: Self-pay | Admitting: Physical Therapy

## 2019-07-15 DIAGNOSIS — R42 Dizziness and giddiness: Secondary | ICD-10-CM | POA: Diagnosis present

## 2019-07-15 DIAGNOSIS — R293 Abnormal posture: Secondary | ICD-10-CM | POA: Insufficient documentation

## 2019-07-15 DIAGNOSIS — M542 Cervicalgia: Secondary | ICD-10-CM | POA: Insufficient documentation

## 2019-07-15 DIAGNOSIS — M25521 Pain in right elbow: Secondary | ICD-10-CM | POA: Diagnosis not present
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
## 2019-07-16 ENCOUNTER — Other Ambulatory Visit: Payer: Self-pay

## 2019-07-16 ENCOUNTER — Emergency Department (HOSPITAL_BASED_OUTPATIENT_CLINIC_OR_DEPARTMENT_OTHER)
Admission: EM | Admit: 2019-07-16 | Discharge: 2019-07-16 | Disposition: A | Discharge status: Home / Self Care | Payer: Medicaid Other | Attending: Emergency Medicine | Admitting: Emergency Medicine

## 2019-07-16 ENCOUNTER — Encounter: Payer: Self-pay | Admitting: Family Medicine

## 2019-07-16 ENCOUNTER — Encounter (HOSPITAL_BASED_OUTPATIENT_CLINIC_OR_DEPARTMENT_OTHER): Payer: Self-pay | Admitting: Emergency Medicine

## 2019-07-16 ENCOUNTER — Ambulatory Visit: Payer: Medicaid Other | Admitting: Family Medicine

## 2019-07-16 VITALS — BP 118/78 | HR 74 | Temp 97.9°F | Ht 61.0 in | Wt 235.8 lb

## 2019-07-16 DIAGNOSIS — G43109 Migraine with aura, not intractable, without status migrainosus: Secondary | ICD-10-CM

## 2019-07-16 DIAGNOSIS — J45909 Unspecified asthma, uncomplicated: Secondary | ICD-10-CM | POA: Diagnosis not present

## 2019-07-16 DIAGNOSIS — L292 Pruritus vulvae: Secondary | ICD-10-CM | POA: Insufficient documentation

## 2019-07-16 DIAGNOSIS — I10 Essential (primary) hypertension: Secondary | ICD-10-CM | POA: Diagnosis not present

## 2019-07-16 DIAGNOSIS — Z881 Allergy status to other antibiotic agents status: Secondary | ICD-10-CM | POA: Insufficient documentation

## 2019-07-16 DIAGNOSIS — R42 Dizziness and giddiness: Secondary | ICD-10-CM

## 2019-07-16 DIAGNOSIS — Z880 Allergy status to penicillin: Secondary | ICD-10-CM | POA: Insufficient documentation

## 2019-07-16 DIAGNOSIS — Z79899 Other long term (current) drug therapy: Secondary | ICD-10-CM | POA: Diagnosis not present

## 2019-07-16 DIAGNOSIS — N898 Other specified noninflammatory disorders of vagina: Secondary | ICD-10-CM

## 2019-07-16 DIAGNOSIS — N899 Noninflammatory disorder of vagina, unspecified: Secondary | ICD-10-CM | POA: Diagnosis present

## 2019-07-16 DIAGNOSIS — H938X1 Other specified disorders of right ear: Secondary | ICD-10-CM

## 2019-07-16 LAB — WET PREP, GENITAL
Clue Cells Wet Prep HPF POC: NONE SEEN
Sperm: NONE SEEN
Trich, Wet Prep: NONE SEEN
Yeast Wet Prep HPF POC: NONE SEEN

## 2019-07-16 LAB — URINALYSIS, ROUTINE W REFLEX MICROSCOPIC
Bilirubin Urine: NEGATIVE
Glucose, UA: NEGATIVE mg/dL
Hgb urine dipstick: NEGATIVE
Ketones, ur: NEGATIVE mg/dL
Leukocytes,Ua: NEGATIVE
Nitrite: NEGATIVE
Protein, ur: NEGATIVE mg/dL
Specific Gravity, Urine: 1.015 (ref 1.005–1.030)
pH: 6.5 (ref 5.0–8.0)

## 2019-07-16 LAB — PREGNANCY, URINE: Preg Test, Ur: NEGATIVE

## 2019-07-16 MED ORDER — FLUCONAZOLE 150 MG PO TABS: ORAL_TABLET | ORAL | 0 refills | Status: DC

## 2019-07-16 MED ORDER — FLUCONAZOLE 150 MG PO TABS
150.0000 mg | ORAL_TABLET | Freq: Once | ORAL | Status: AC
  Administered 2019-07-16: 150 mg via ORAL
  Filled 2019-07-16: qty 1

## 2019-07-16 NOTE — ED Provider Notes (Signed)
[facility] Provider Note: Georgena Spurling, MD, FACEP  CSN: [id number] MRN: [id number] ARRIVAL: 07/16/19 at [place]: [place]  Vaginal Discharge   HISTORY OF PRESENT ILLNESS  07/16/19 1:26 AM Rebecca Blackburn is a 34 y.o. female who was treated for bacterial vaginosis June 21, 2019 after being seen for vaginal itching and discharge.  She took a course of Flagyl but continues to have vaginal itching and discharge.  She describes the discharge is clear but not severe.  She cannot tell if it is malodorous due to loss of smell from Covid back in September.  She denies any pain.   Past Medical History:  Diagnosis Date  . Asthma   . High blood pressure   . Migraine   . Varicella     Past Surgical History:  Procedure Laterality Date  . CESAREAN SECTION     2016 and 2006  . SKIN BIOPSY    . TONSILLECTOMY      Family History  Problem Relation Age of Onset  . Hypertension Mother   . Thyroid disease Father   . Diabetes Maternal Grandmother   . Diabetes Maternal Grandfather   . Migraines Neg Hx     Social History   Tobacco Use  . Smoking status: Never Smoker  . Smokeless tobacco: Never Used  Substance Use Topics  . Alcohol use: Yes    Comment: occasional  . Drug use: Never    Prior to Admission medications   Medication Sig Start Date End Date Taking? Authorizing Provider  acyclovir (ZOVIRAX) 400 MG tablet Take 400 mg by mouth 2 (two) times daily.    [provider]  albuterol (PROAIR HFA) 108 (90 Base) MCG/ACT inhaler INHALE 2 PUFFS 3 TIMES A DAY 05/19/16   [provider]  Biotin 1 MG CAPS Take by mouth.    [provider]  fluconazole (DIFLUCAN) 150 MG tablet Take on 07/19/2019. 07/16/19   Aubryana Vittorio, MD  losartan (COZAAR) 100 MG tablet Take 50 mg by mouth daily.  09/15/18   [provider]  Multiple Vitamin (MULTIVITAMIN) capsule Take 1 capsule by mouth daily.    [provider]  nortriptyline  (PAMELOR) 10 MG capsule Take 1 capsule (10 mg total) by mouth at bedtime. 05/20/19   Ward Givens, NP  rizatriptan (MAXALT-MLT) 10 MG disintegrating tablet Take 1 tablet (10 mg total) by mouth as needed for migraine. May repeat in 2 hours if needed 03/18/19   Melvenia Beam, MD    Allergies Amoxicillin, Cefaclor, and Cefuroxime axetil   REVIEW OF SYSTEMS  Negative except as noted here or in the History of Present Illness.   PHYSICAL EXAMINATION  Initial Vital Signs Blood pressure 117/79, pulse 69, temperature 98.1 F (36.7 C), temperature source Oral, resp. rate 16, height 5\' 1"  (1.549 m), weight 104.3 kg, last menstrual period 07/10/2019, SpO2 99 %.  Examination General: Well-developed, well-nourished female in no acute distress; appearance consistent with age of record HENT: normocephalic; atraumatic Eyes: Normal appearance Neck: supple Heart: regular rate and rhythm Lungs: clear to auscultation bilaterally Abdomen: soft; nondistended; nontender; bowel sounds present GU: Normal external genitalia; physiologic appearing vaginal discharge; no vaginal bleeding; no cervical motion tenderness Extremities: No deformity; full range of motion; pulses normal Neurologic: Awake, alert and oriented; motor function intact in all extremities and symmetric; no facial droop Skin: Warm and dry Psychiatric: Normal mood and affect   RESULTS  Summary of this visit's results, reviewed and interpreted by  myself:   EKG Interpretation  Date/Time:    Ventricular Rate:    PR Interval:    QRS Duration:   QT Interval:    QTC Calculation:   R Axis:     Text Interpretation:        Laboratory Studies: Results for orders placed or performed during the hospital encounter of 07/16/19 (from the past 24 hour(s))  Wet prep, genital     Status: Abnormal   Collection Time: 07/16/19  1:26 AM   Specimen: Cervix  Result Value Ref Range   Yeast Wet Prep HPF POC NONE SEEN NONE SEEN   Trich, Wet Prep  NONE SEEN NONE SEEN   Clue Cells Wet Prep HPF POC NONE SEEN NONE SEEN   WBC, Wet Prep HPF POC FEW (A) NONE SEEN   Sperm NONE SEEN   Urinalysis, Routine w reflex microscopic     Status: None   Collection Time: 07/16/19  1:35 AM  Result Value Ref Range   Color, Urine YELLOW YELLOW   APPearance CLEAR CLEAR   Specific Gravity, Urine 1.015 1.005 - 1.030   pH 6.5 5.0 - 8.0   Glucose, UA NEGATIVE NEGATIVE mg/dL   Hgb urine dipstick NEGATIVE NEGATIVE   Bilirubin Urine NEGATIVE NEGATIVE   Ketones, ur NEGATIVE NEGATIVE mg/dL   Protein, ur NEGATIVE NEGATIVE mg/dL   Nitrite NEGATIVE NEGATIVE   Leukocytes,Ua NEGATIVE NEGATIVE  Pregnancy, urine     Status: None   Collection Time: 07/16/19  1:35 AM  Result Value Ref Range   Preg Test, Ur NEGATIVE NEGATIVE   Imaging Studies: No results found.  ED COURSE and MDM  Nursing notes, initial and subsequent vitals signs, including pulse oximetry, reviewed and interpreted by myself.  Vitals:   07/16/19 0119 07/16/19 0121  BP:  117/79  Pulse:  69  Resp:  16  Temp:  98.1 F (36.7 C)  TempSrc:  Oral  SpO2:  99%  Weight: 104.3 kg   Height: 5\' 1"  (1.549 m)    Medications  fluconazole (DIFLUCAN) tablet 150 mg (has no administration in time range)    Patient has no clue cells on wet prep and she was appropriately treated for bacterial vaginosis.  I suspect her symptoms are more likely due to an occult yeast infection and we will treat accordingly.  Yeast does not always show up on vaginal wet preparations.  PROCEDURES  Procedures   ED DIAGNOSES     ICD-10-CM   1. Vaginal itching  N89.8   2. Vaginal discharge  N89.8        Shanon Rosser, MD 07/16/19 [phone]
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
## 2019-07-16 NOTE — Patient Instructions (Addendum)
We will continue rizatriptan   Please follow up with PCP for concerns of dizziness. I would recommend looking at blood counts, blood sugars, and potassium. I am unable to view PCP labs but see a slightly low potassium in 12/2018.   Make sure to stay well hydrated. I recommend 50-60 of water daily. Eat well balanced meals. Try not to skip meals.   Make sure you get an eye exam.   Dizziness Dizziness is a common problem. It makes you feel unsteady or light-headed. You may feel like you are about to pass out (faint). Dizziness can lead to getting hurt if you stumble or fall. Dizziness can be caused by many things, including:  Medicines.  Not having enough water in your body (dehydration).  Illness. Follow these instructions at home: Eating and drinking   Drink enough fluid to keep your pee (urine) clear or pale yellow. This helps to keep you from getting dehydrated. Try to drink more clear fluids, such as water.  Do not drink alcohol.  Limit how much caffeine you drink or eat, if your doctor tells you to do that.  Limit how much salt (sodium) you drink or eat, if your doctor tells you to do that. Activity   Avoid making quick movements. ? When you stand up from sitting in a chair, steady yourself until you feel okay. ? In the morning, first sit up on the side of the bed. When you feel okay, stand slowly while you hold onto something. Do this until you know that your balance is fine.  If you need to stand in one place for a long time, move your legs often. Tighten and relax the muscles in your legs while you are standing.  Do not drive or use heavy machinery if you feel dizzy.  Avoid bending down if you feel dizzy. Place items in your home so you can reach them easily without leaning over. Lifestyle  Do not use any products that contain nicotine or tobacco, such as cigarettes and e-cigarettes. If you need help quitting, ask your doctor.  Try to lower your stress level.  You can do this by using methods such as yoga or meditation. Talk with your doctor if you need help. General instructions  Watch your dizziness for any changes.  Take over-the-counter and prescription medicines only as told by your doctor. Talk with your doctor if you think that you are dizzy because of a medicine that you are taking.  Tell a friend or a family member that you are feeling dizzy. If he or she notices any changes in your behavior, have this person call your doctor.  Keep all follow-up visits as told by your doctor. This is important. Contact a doctor if:  Your dizziness does not go away.  Your dizziness or light-headedness gets worse.  You feel sick to your stomach (nauseous).  You have trouble hearing.  You have new symptoms.  You are unsteady on your feet.  You feel like the room is spinning. Get help right away if:  You throw up (vomit) or have watery poop (diarrhea), and you cannot eat or drink anything.  You have trouble: ? Talking. ? Walking. ? Swallowing. ? Using your arms, hands, or legs.  You feel generally weak.  You are not thinking clearly, or you have trouble forming sentences. A friend or family member may notice this.  You have: ? Chest pain. ? Pain in your belly (abdomen). ? Shortness of breath. ?  Sweating.  Your vision changes.  You are bleeding.  You have a very bad headache.  You have neck pain or a stiff neck.  You have a fever. These symptoms may be an emergency. Do not wait to see if the symptoms will go away. Get medical help right away. Call your local emergency services (911 in the [place] Do not drive yourself to the hospital. Summary  Dizziness makes you feel unsteady or light-headed. You may feel like you are about to pass out (faint).  Drink enough fluid to keep your pee (urine) clear or pale yellow. Do not drink alcohol.  Avoid making quick movements if you feel dizzy.  Watch your dizziness for any changes.  This information is not intended to replace advice given to you by your health care provider. Make sure you discuss any questions you have with your health care provider. Document Revised: 03/01/2017 Document Reviewed: 03/15/2016 Elsevier Patient Education  [employer].   Migraine Headache A migraine headache is a very strong throbbing pain on one side or both sides of your head. This type of headache can also cause other symptoms. It can last from 4 hours to 3 days. Talk with your doctor about what things may bring on (trigger) this condition. What are the causes? The exact cause of this condition is not known. This condition may be triggered or caused by:  Drinking alcohol.  Smoking.  Taking medicines, such as: ? Medicine used to treat chest pain (nitroglycerin). ? Birth control pills. ? Estrogen. ? Some blood pressure medicines.  Eating or drinking certain products.  Doing physical activity. Other things that may trigger a migraine headache include:  Having a menstrual period.  Pregnancy.  Hunger.  Stress.  Not getting enough sleep or getting too much sleep.  Weather changes.  Tiredness (fatigue). What increases the risk?  Being 11-27 years old.  Being female.  Having a family history of migraine headaches.  Being Caucasian.  Having depression or anxiety.  Being very overweight. What are the signs or symptoms?  A throbbing pain. This pain may: ? Happen in any area of the head, such as on one side or both sides. ? Make it hard to do daily activities. ? Get worse with physical activity. ? Get worse around bright lights or loud noises.  Other symptoms may include: ? Feeling sick to your stomach (nauseous). ? Vomiting. ? Dizziness. ? Being sensitive to bright lights, loud noises, or smells.  Before you get a migraine headache, you may get warning signs (an aura). An aura may include: ? Seeing flashing lights or having blind spots. ? Seeing  bright spots, halos, or zigzag lines. ? Having tunnel vision or blurred vision. ? Having numbness or a tingling feeling. ? Having trouble talking. ? Having weak muscles.  Some people have symptoms after a migraine headache (postdromal phase), such as: ? Tiredness. ? Trouble thinking (concentrating). How is this treated?  Taking medicines that: ? Relieve pain. ? Relieve the feeling of being sick to your stomach. ? Prevent migraine headaches.  Treatment may also include: ? Having acupuncture. ? Avoiding foods that bring on migraine headaches. ? Learning ways to control your body functions (biofeedback). ? Therapy to help you know and deal with negative thoughts (cognitive behavioral therapy). Follow these instructions at home: Medicines  Take over-the-counter and prescription medicines only as told by your doctor.  Ask your doctor if the medicine prescribed to you: ? Requires you to avoid driving or using heavy  machinery. ? Can cause trouble pooping (constipation). You may need to take these steps to prevent or treat trouble pooping:  Drink enough fluid to keep your pee (urine) pale yellow.  Take over-the-counter or prescription medicines.  Eat foods that are high in fiber. These include beans, whole grains, and fresh fruits and vegetables.  Limit foods that are high in fat and sugar. These include fried or sweet foods. Lifestyle  Do not drink alcohol.  Do not use any products that contain nicotine or tobacco, such as cigarettes, e-cigarettes, and chewing tobacco. If you need help quitting, ask your doctor.  Get at least 8 hours of sleep every night.  Limit and deal with stress. General instructions      Keep a journal to find out what may bring on your migraine headaches. For example, write down: ? What you eat and drink. ? How much sleep you get. ? Any change in what you eat or drink. ? Any change in your medicines.  If you have a migraine headache: ? Avoid  things that make your symptoms worse, such as bright lights. ? It may help to lie down in a dark, quiet room. ? Do not drive or use heavy machinery. ? Ask your doctor what activities are safe for you.  Keep all follow-up visits as told by your doctor. This is important. Contact a doctor if:  You get a migraine headache that is different or worse than others you have had.  You have more than 15 headache days in one month. Get help right away if:  Your migraine headache gets very bad.  Your migraine headache lasts longer than 72 hours.  You have a fever.  You have a stiff neck.  You have trouble seeing.  Your muscles feel weak or like you cannot control them.  You start to lose your balance a lot.  You start to have trouble walking.  You pass out (faint).  You have a seizure. Summary  A migraine headache is a very strong throbbing pain on one side or both sides of your head. These headaches can also cause other symptoms.  This condition may be treated with medicines and changes to your lifestyle.  Keep a journal to find out what may bring on your migraine headaches.  Contact a doctor if you get a migraine headache that is different or worse than others you have had.  Contact your doctor if you have more than 15 headache days in a month. This information is not intended to replace advice given to you by your health care provider. Make sure you discuss any questions you have with your health care provider. Document Revised: 06/20/2018 Document Reviewed: 04/10/2018 Elsevier Patient Education  [place].

## 2019-07-16 NOTE — ED Triage Notes (Signed)
Pt states she was seen here on April 11th for vaginal itching and discharge  Pt states she was given flagyl and completed it but continues to have vaginal itching and discharge

## 2019-07-17 LAB — GC/CHLAMYDIA PROBE AMP (~~LOC~~) NOT AT ARMC
Chlamydia: NEGATIVE
Comment: NEGATIVE
Comment: NORMAL
Neisseria Gonorrhea: NEGATIVE

## 2019-07-22 ENCOUNTER — Ambulatory Visit: Payer: Medicaid Other | Admitting: Physical Therapy

## 2019-07-22 ENCOUNTER — Other Ambulatory Visit: Payer: Self-pay

## 2019-07-22 ENCOUNTER — Encounter: Payer: Self-pay | Admitting: Physical Therapy

## 2019-07-22 DIAGNOSIS — R42 Dizziness and giddiness: Secondary | ICD-10-CM

## 2019-07-22 DIAGNOSIS — M25521 Pain in right elbow: Secondary | ICD-10-CM | POA: Diagnosis not present

## 2019-07-22 DIAGNOSIS — R293 Abnormal posture: Secondary | ICD-10-CM

## 2019-07-22 DIAGNOSIS — M542 Cervicalgia: Secondary | ICD-10-CM
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
## 2019-07-29 ENCOUNTER — Ambulatory Visit: Payer: Medicaid Other | Admitting: Physical Therapy

## 2019-07-29 ENCOUNTER — Other Ambulatory Visit: Payer: Self-pay

## 2019-07-29 DIAGNOSIS — M25521 Pain in right elbow: Secondary | ICD-10-CM

## 2019-07-29 DIAGNOSIS — R293 Abnormal posture: Secondary | ICD-10-CM

## 2019-07-29 DIAGNOSIS — R42 Dizziness and giddiness: Secondary | ICD-10-CM
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
## 2019-08-05 ENCOUNTER — Other Ambulatory Visit: Payer: Self-pay

## 2019-08-05 ENCOUNTER — Encounter: Payer: Self-pay | Admitting: Physical Therapy

## 2019-08-05 ENCOUNTER — Ambulatory Visit: Payer: Medicaid Other | Admitting: Physical Therapy

## 2019-08-05 DIAGNOSIS — M25521 Pain in right elbow: Secondary | ICD-10-CM

## 2019-08-05 DIAGNOSIS — R42 Dizziness and giddiness: Secondary | ICD-10-CM
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
## 2019-08-07 ENCOUNTER — Ambulatory Visit (INDEPENDENT_AMBULATORY_CARE_PROVIDER_SITE_OTHER): Payer: Medicaid Other | Admitting: Podiatry

## 2019-08-07 ENCOUNTER — Encounter: Payer: Self-pay | Admitting: Podiatry

## 2019-08-07 ENCOUNTER — Other Ambulatory Visit: Payer: Self-pay

## 2019-08-07 DIAGNOSIS — B070 Plantar wart: Secondary | ICD-10-CM

## 2019-08-07 DIAGNOSIS — D492 Neoplasm of unspecified behavior of bone, soft tissue, and skin: Secondary | ICD-10-CM

## 2019-08-07 NOTE — Progress Notes (Signed)
  Subjective:  Patient ID: Rebecca Blackburn, female    DOB: 1985/09/29,  MRN: [id number]  Chief Complaint  Patient presents with  . Foot Problem    Right plantar forefoot painful lesion, 3 weeks duration, itching/burning, tried OTC wart medication did not help.    34 y.o. female presents with the above complaint. History confirmed with patient.   Objective:  Physical Exam: warm, good capillary refill, no trophic changes or ulcerative lesions, normal DP and PT pulses and normal sensory exam. Right Foot: normal exam, no swelling, tenderness, instability; ligaments intact, full range of motion of all ankle/foot joints. Right plantar forefoot painful lesion with maceration  No images are attached to the encounter. Assessment:   1. Verruca plantaris      Plan:  Patient was evaluated and treated and all questions answered.  ?Verruca right foot  -Lesion maximally debrided. -Unable to assess cause given heavy maceration -Dressed with betadine  Return in about 2 weeks (around 08/21/2019) for wart f/u.

## 2019-08-21 ENCOUNTER — Ambulatory Visit: Payer: Medicaid Other | Admitting: Podiatry

## 2019-08-21 ENCOUNTER — Other Ambulatory Visit: Payer: Self-pay

## 2019-08-21 ENCOUNTER — Encounter: Payer: Self-pay | Admitting: Physical Therapy

## 2019-08-21 ENCOUNTER — Ambulatory Visit: Payer: Medicaid Other | Attending: Otolaryngology | Admitting: Physical Therapy

## 2019-08-21 DIAGNOSIS — R42 Dizziness and giddiness: Secondary | ICD-10-CM | POA: Insufficient documentation

## 2019-08-21 DIAGNOSIS — M25521 Pain in right elbow: Secondary | ICD-10-CM | POA: Diagnosis present

## 2019-08-21 DIAGNOSIS — M542 Cervicalgia: Secondary | ICD-10-CM | POA: Diagnosis present

## 2019-08-21 DIAGNOSIS — R293 Abnormal posture: Secondary | ICD-10-CM | POA: Insufficient documentation

## 2019-08-21 DIAGNOSIS — M5432 Sciatica, left side: Secondary | ICD-10-CM | POA: Diagnosis present

## 2019-08-21 NOTE — Patient Instructions (Signed)
Access Code: [facility] URL: [email or web address] Date: 08/21/2019 Prepared by: Misty Stanley  Program Notes Hold stretches for 30 seconds and continue to take deep, slow breaths - inhale slowly for 4 seconds and then exhale slowly for 4 seconds.   Exercises Hooklying Hamstring Stretch with Strap - 1 x daily - 7 x weekly - 2 sets - 30 second hold Supine Sciatic Nerve Glide - 1 x daily - 7 x weekly - 1 sets - 10 reps - 2-3 second hold Supine ITB Stretch with Strap - 1 x daily - 7 x weekly - 2 sets - 30 second hold Seated Piriformis Stretch - 1 x daily - 7 x weekly - 2 sets - 30 second hold
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
## 2019-08-22 NOTE — Addendum Note (Signed)
Addended by: Rico Junker on: 08/22/2019 02:45 PM   Modules accepted: Orders

## 2019-08-27 ENCOUNTER — Ambulatory Visit: Payer: Medicaid Other | Admitting: Physical Therapy

## 2019-08-28 ENCOUNTER — Other Ambulatory Visit: Payer: Self-pay

## 2019-08-28 ENCOUNTER — Ambulatory Visit: Payer: Medicaid Other | Admitting: Physical Therapy

## 2019-08-28 ENCOUNTER — Ambulatory Visit: Payer: Medicaid Other | Admitting: Podiatry

## 2019-08-28 ENCOUNTER — Encounter: Payer: Self-pay | Admitting: Physical Therapy

## 2019-08-28 VITALS — Temp 97.1°F

## 2019-08-28 DIAGNOSIS — D492 Neoplasm of unspecified behavior of bone, soft tissue, and skin: Secondary | ICD-10-CM

## 2019-08-28 DIAGNOSIS — M5432 Sciatica, left side: Secondary | ICD-10-CM | POA: Diagnosis not present

## 2019-08-28 DIAGNOSIS — R293 Abnormal posture: Secondary | ICD-10-CM

## 2019-08-28 DIAGNOSIS — B079 Viral wart, unspecified: Secondary | ICD-10-CM

## 2019-08-28 DIAGNOSIS — M25521 Pain in right elbow: Secondary | ICD-10-CM
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
## 2019-08-28 NOTE — Progress Notes (Signed)
  Subjective:  Patient ID: Brenda Samano, female    DOB: 11/26/1985,  MRN: [id number]  Chief Complaint  Patient presents with  . Follow-up    R plantar forefoot submet 4 - callus vs. plantar's wart per pt. Pt stated, "Pain = 3-4/10, burning. That's definitely better - I rated it as 8/10 during my last appt".   34 y.o. female presents with the above complaint. History confirmed with patient.   Objective:  Physical Exam: warm, good capillary refill, no trophic changes or ulcerative lesions, normal DP and PT pulses and normal sensory exam. Right Foot: submet 4 KPH with punctate core   No images are attached to the encounter. Assessment:   1. Verruca    Plan:  Patient was evaluated and treated and all questions answered.  Verruca Plantar is right -Educated on etiology -Lesion debrided and destroyed  Procedure: Destruction of Lesion Location: right forefoot Anesthesia: none Instrumentation: 15 blade. Technique: Debridement of small amount of canthrone applied to the base of the lesion. Dressing: Dry, sterile, compression dressing. Disposition: Patient tolerated procedure well. Advised to leave dressing on for 6-8 hours. Thereafter patient to wash the area with soap and water and applied band-aid. Off-loading pads dispensed. Patient to return in 2 weeks for follow-up.  Return if symptoms worsen or fail to improve.

## 2019-09-02 ENCOUNTER — Other Ambulatory Visit: Payer: Self-pay

## 2019-09-02 ENCOUNTER — Ambulatory Visit: Payer: Medicaid Other | Admitting: Physical Therapy

## 2019-09-02 ENCOUNTER — Encounter: Payer: Self-pay | Admitting: Physical Therapy

## 2019-09-02 DIAGNOSIS — M25521 Pain in right elbow: Secondary | ICD-10-CM

## 2019-09-02 DIAGNOSIS — R293 Abnormal posture: Secondary | ICD-10-CM

## 2019-09-02 DIAGNOSIS — M5432 Sciatica, left side: Secondary | ICD-10-CM
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
## 2019-09-09 ENCOUNTER — Other Ambulatory Visit: Payer: Self-pay

## 2019-09-09 ENCOUNTER — Ambulatory Visit: Payer: Medicaid Other | Admitting: Physical Therapy

## 2019-09-09 DIAGNOSIS — M5432 Sciatica, left side: Secondary | ICD-10-CM

## 2019-09-09 DIAGNOSIS — R42 Dizziness and giddiness: Secondary | ICD-10-CM

## 2019-09-09 NOTE — Patient Instructions (Addendum)
Access Code: [facility] URL: [email or web address] Date: 09/09/2019 Prepared by: Misty Stanley  Program Notes Hold stretches for 30 seconds and continue to take deep, slow breaths - inhale slowly for 4 seconds and then exhale slowly for 4 seconds.   Exercises Hooklying Hamstring Stretch with Strap - 1 x daily - 7 x weekly - 2 sets - 30 second hold Supine ITB Stretch with Strap - 1 x daily - 7 x weekly - 2 sets - 30 second hold Seated Piriformis Stretch - 1 x daily - 7 x weekly - 2 sets - 30 second hold Seated Slump Nerve Glide - 1 x daily - 7 x weekly - 2 sets - 10 reps   Gastroc / Heel Cord Stretch - On Step    Stand with heels over edge of stair. Holding rail, lower LEFT heel until stretch is felt in calf.  HOLD 15 seconds.   Repeat _3__ times. Do _2__ times per day. .   [email or web address]
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
## 2019-09-10 ENCOUNTER — Telehealth: Payer: Self-pay

## 2019-09-10 NOTE — Telephone Encounter (Signed)
Can you call patient and see if she started nortriptyline 10mg  at bedtime. If not, this is my recommendation. When we last spoke in May, she had not started this yet. It can take 4-6 weeks to start working. We could consider increasing dose to 20mg  at bedtime if she is taking it and tolerating it well. We can schedule a follow up to discuss other medication options for headache prevention if needed. TY

## 2019-09-10 NOTE — Telephone Encounter (Signed)
Spoke to pt she has not started nortriptyline 10 mg,  She agrees with amy, NP, recommendation.  She will start nortriptyline 10 mg at bedtime

## 2019-09-10 NOTE — Telephone Encounter (Signed)
Pt called office. She would like to discuss starting a preventative for her migarines. Maxalt is helping but she cannot take it everyday.

## 2019-09-16 ENCOUNTER — Ambulatory Visit: Payer: Medicaid Other | Attending: Otolaryngology | Admitting: Physical Therapy

## 2019-09-16 ENCOUNTER — Encounter: Payer: Self-pay | Admitting: Physical Therapy

## 2019-09-16 ENCOUNTER — Other Ambulatory Visit: Payer: Self-pay

## 2019-09-16 DIAGNOSIS — R293 Abnormal posture: Secondary | ICD-10-CM | POA: Diagnosis present

## 2019-09-16 DIAGNOSIS — R42 Dizziness and giddiness: Secondary | ICD-10-CM | POA: Diagnosis present

## 2019-09-16 DIAGNOSIS — M25521 Pain in right elbow: Secondary | ICD-10-CM | POA: Insufficient documentation

## 2019-09-16 DIAGNOSIS — M5432 Sciatica, left side: Secondary | ICD-10-CM

## 2019-09-16 DIAGNOSIS — M542 Cervicalgia: Secondary | ICD-10-CM | POA: Insufficient documentation
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
## 2019-09-21 ENCOUNTER — Ambulatory Visit: Payer: Medicaid Other | Admitting: Physical Therapy

## 2019-09-21 ENCOUNTER — Encounter: Payer: Self-pay | Admitting: Physical Therapy

## 2019-09-21 ENCOUNTER — Other Ambulatory Visit: Payer: Self-pay

## 2019-09-21 DIAGNOSIS — M5432 Sciatica, left side: Secondary | ICD-10-CM

## 2019-09-21 DIAGNOSIS — M25521 Pain in right elbow: Secondary | ICD-10-CM

## 2019-09-21 DIAGNOSIS — M542 Cervicalgia: Secondary | ICD-10-CM

## 2019-09-21 DIAGNOSIS — R293 Abnormal posture: Secondary | ICD-10-CM

## 2019-09-21 DIAGNOSIS — R42 Dizziness and giddiness: Secondary | ICD-10-CM

## 2019-09-21 NOTE — Patient Instructions (Addendum)
NECK/BACK/CHEST STRETCHES Access Code: [email or web address] URL: [email or web address] Date: 09/21/2019 Prepared by: Misty Stanley  Exercises Seated Upper Trapezius Stretch - 2-3 x daily - 7 x weekly - 2-3 reps - 1 sets - 20-30 secs hold Seated Levator Scapulae Stretch - 2-3 x daily - 7 x weekly - 3 sets - 20 second hold Standing Shoulder External Rotation Stretch in Doorway - 1 x daily - 7 x weekly - 2 sets - 20 second hold Child's Pose with Sidebending - 1 x daily - 7 x weekly - 3 sets - 20 second hold  SCIATICA EXERCISES Access Code: [facility] URL: [email or web address] Date: 09/21/2019 Prepared by: Misty Stanley  Program Notes Hold stretches for 30 seconds and continue to take deep, slow breaths - inhale slowly for 4 seconds and then exhale slowly for 4 seconds.  Exercises Hooklying Hamstring Stretch with Strap - 1 x daily - 7 x weekly - 2 sets - 30 second hold Supine ITB Stretch with Strap - 1 x daily - 7 x weekly - 2 sets - 30 second hold Seated Piriformis Stretch - 1 x daily - 7 x weekly - 2 sets - 30 second hold Seated Slump Nerve Glide - 1 x daily - 7 x weekly - 2 sets - 10 reps Standing Gastroc Stretch on Step with Counter Support - 1 x daily - 7 x weekly - 2 sets - 30 seconds hold   ELBOW EXERCISES Access Code: [email or web address] URL: [email or web address] Date: 07/08/2019 Prepared by: Misty Stanley  Exercises Seated Wrist Flexion with Overpressure - 1 x daily - 7 x weekly - 5 reps - 30 seconds hold Wrist Flexion Active Stretch in Neutral - 1 x daily - 7 x weekly - 5 reps - 30 seconds hold Seated Isometric Wrist Flexion Supinated with Manual Resistance - 1 x daily - 7 x weekly - 10 reps - 1 sets - 6 second hold
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
## 2019-09-23 ENCOUNTER — Ambulatory Visit: Payer: Medicaid Other | Admitting: Physical Therapy

## 2019-09-30 ENCOUNTER — Encounter (HOSPITAL_BASED_OUTPATIENT_CLINIC_OR_DEPARTMENT_OTHER): Payer: Self-pay | Admitting: Emergency Medicine

## 2019-09-30 ENCOUNTER — Emergency Department (HOSPITAL_BASED_OUTPATIENT_CLINIC_OR_DEPARTMENT_OTHER): Payer: Medicaid Other

## 2019-09-30 ENCOUNTER — Emergency Department (HOSPITAL_BASED_OUTPATIENT_CLINIC_OR_DEPARTMENT_OTHER)
Admission: EM | Admit: 2019-09-30 | Discharge: 2019-09-30 | Disposition: A | Discharge status: Home / Self Care | Payer: Medicaid Other | Attending: Emergency Medicine | Admitting: Emergency Medicine

## 2019-09-30 ENCOUNTER — Other Ambulatory Visit: Payer: Self-pay

## 2019-09-30 DIAGNOSIS — M545 Low back pain: Secondary | ICD-10-CM | POA: Diagnosis not present

## 2019-09-30 DIAGNOSIS — M79605 Pain in left leg: Secondary | ICD-10-CM | POA: Diagnosis not present

## 2019-09-30 DIAGNOSIS — Z79899 Other long term (current) drug therapy: Secondary | ICD-10-CM | POA: Diagnosis not present

## 2019-09-30 DIAGNOSIS — J45909 Unspecified asthma, uncomplicated: Secondary | ICD-10-CM | POA: Diagnosis not present

## 2019-09-30 DIAGNOSIS — I10 Essential (primary) hypertension: Secondary | ICD-10-CM | POA: Diagnosis not present

## 2019-09-30 DIAGNOSIS — M5442 Lumbago with sciatica, left side: Secondary | ICD-10-CM

## 2019-09-30 LAB — CBC WITH DIFFERENTIAL/PLATELET
Abs Immature Granulocytes: 0.02 10*3/uL (ref 0.00–0.07)
Basophils Absolute: 0 10*3/uL (ref 0.0–0.1)
Basophils Relative: 0 %
Eosinophils Absolute: 0.3 10*3/uL (ref 0.0–0.5)
Eosinophils Relative: 3 %
HCT: 38.1 % (ref 36.0–46.0)
Hemoglobin: 12.6 g/dL (ref 12.0–15.0)
Immature Granulocytes: 0 %
Lymphocytes Relative: 40 %
Lymphs Abs: 3.7 10*3/uL (ref 0.7–4.0)
MCH: 28.8 pg (ref 26.0–34.0)
MCHC: 33.1 g/dL (ref 30.0–36.0)
MCV: 87.2 fL (ref 80.0–100.0)
Monocytes Absolute: 0.5 10*3/uL (ref 0.1–1.0)
Monocytes Relative: 6 %
Neutro Abs: 4.7 10*3/uL (ref 1.7–7.7)
Neutrophils Relative %: 51 %
Platelets: 263 10*3/uL (ref 150–400)
RBC: 4.37 MIL/uL (ref 3.87–5.11)
RDW: 14.5 % (ref 11.5–15.5)
WBC: 9.3 10*3/uL (ref 4.0–10.5)
nRBC: 0 % (ref 0.0–0.2)

## 2019-09-30 LAB — BASIC METABOLIC PANEL
Anion gap: 10 (ref 5–15)
BUN: 12 mg/dL (ref 6–20)
CO2: 25 mmol/L (ref 22–32)
Calcium: 8.6 mg/dL — ABNORMAL LOW (ref 8.9–10.3)
Chloride: 104 mmol/L (ref 98–111)
Creatinine, Ser: 0.72 mg/dL (ref 0.44–1.00)
GFR calc Af Amer: >60 mL/min (ref >60)
GFR calc non Af Amer: >60 mL/min (ref >60)
Glucose, Bld: 119 mg/dL — ABNORMAL HIGH (ref 70–99)
Potassium: 3.8 mmol/L (ref 3.5–5.1)
Sodium: 139 mmol/L (ref 135–145)

## 2019-09-30 LAB — D-DIMER, QUANTITATIVE: D-Dimer, Quant: 0.39 ug/mL-FEU (ref 0.00–0.50)

## 2019-09-30 LAB — PREGNANCY, URINE: Preg Test, Ur: NEGATIVE

## 2019-09-30 MED ORDER — FAMOTIDINE 40 MG PO TABS: 40.0000 mg | ORAL_TABLET | ORAL | Status: DC | PRN

## 2019-09-30 MED ORDER — MELOXICAM 7.5 MG PO TABS
7.5000 mg | ORAL_TABLET | Freq: Every day | ORAL | 0 refills | Status: DC
Start: 2019-09-30 — End: 2020-07-21

## 2019-09-30 MED ORDER — KETOROLAC TROMETHAMINE 10 MG PO TABS: 10.0000 mg | ORAL_TABLET | Freq: Four times a day (QID) | ORAL | Status: DC | PRN

## 2019-09-30 MED ORDER — CYCLOBENZAPRINE HCL 10 MG PO TABS: 10.0000 mg | ORAL_TABLET | Freq: Two times a day (BID) | ORAL | 0 refills | Status: DC | PRN

## 2019-09-30 NOTE — ED Triage Notes (Addendum)
Pt is c/o left calf pain x 1 month  Pt states she has sciatica but this feels different  Pt states she feels like her calf is swollen and crampy but not like a regular cramp   Pt states she is in physical therapy for her sciatica  Pt also c/o back pain in the middle of her back like maybe she pulled a muscle

## 2019-09-30 NOTE — ED Notes (Signed)
ED Provider at bedside. 

## 2019-09-30 NOTE — ED Provider Notes (Signed)
[facility] EMERGENCY DEPARTMENT Provider Note   CSN: [id number] Arrival date & time: 09/30/19  0256     History Chief Complaint  Patient presents with  . Leg Pain    Rebecca Blackburn is a 34 y.o. female.  History of sciatica these pains are different.  He states that she feels like her left calf is swollen she does not feel that she has cramping the back of it worse when she moves her foot a certain way.  No history of blood clots.  No recent car rides or surgeries.  Not on birth control or other hormones.  No injuries.  She also states that she has some left back tightness that is unusual for seems to be made worse when she takes a deep breath.  No loss of consciousness or shortness of breath.  No fevers or coughing.   Leg Pain Location:  Leg Time since incident:  5 days Injury: no   Leg location:  L leg Pain details:    Quality:  Aching and cramping   Radiates to:  Groin   Severity:  Mild   Onset quality:  Gradual   Timing:  Constant      Past Medical History:  Diagnosis Date  . Asthma   . High blood pressure   . Migraine   . Varicella     Patient Active Problem List   Diagnosis Date Noted  . Migraine with aura and without status migrainosus, not intractable 03/18/2019  . Seborrheic dermatitis 02/13/2019  . Dizziness 01/13/2019  . 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 ASCUS of cervix with negative high risk HPV 11/20/2018  . Encounter for gynecological examination without abnormal finding 11/07/2018  . Genital warts 09/30/2018  . History of loop electrical excision procedure (LEEP) 09/30/2018  . 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 Essential hypertension 01/23/2018  . GERD with esophagitis 01/23/2018  . Migraine without aura and without status migrainosus, not intractable 01/23/2018  . Morbid obesity due to excess calories ([place]) 01/23/2018    Past Surgical History:  Procedure Laterality Date  . CESAREAN SECTION     2016 and 2006  . SKIN BIOPSY     . TONSILLECTOMY       OB History   No obstetric history on file.     Family History  Problem Relation Age of Onset  . Hypertension Mother   . Thyroid disease Father   . Diabetes Maternal Grandmother   . Diabetes Maternal Grandfather   . Migraines Neg Hx     Social History   Tobacco Use  . Smoking status: Never Smoker  . Smokeless tobacco: Never Used  Vaping Use  . Vaping Use: Never used  Substance Use Topics  . Alcohol use: Yes    Comment: occasional  . Drug use: Never    Home Medications Prior to Admission medications   Medication Sig Start Date End Date Taking? Authorizing Provider  albuterol (PROAIR HFA) 108 (90 Base) MCG/ACT inhaler INHALE 2 PUFFS 3 TIMES A DAY 05/19/16   [provider]  cyclobenzaprine (FLEXERIL) 10 MG tablet Take 1 tablet (10 mg total) by mouth 2 (two) times daily as needed for muscle spasms. 09/30/19   Purnell Daigle, Corene Cornea, MD  famotidine (PEPCID) 40 MG tablet Take 1 tablet (40 mg total) by mouth as needed for heartburn or indigestion. 09/30/19   Naly Schwanz, Corene Cornea, MD  ketorolac (TORADOL) 10 MG tablet Take 1 tablet (10 mg total) by mouth every 6 (six) hours as needed. 09/30/19  Lizbett Garciagarcia, Corene Cornea, MD  losartan (COZAAR) 100 MG tablet Take 50 mg by mouth daily.  09/15/18   [provider]  meloxicam (MOBIC) 7.5 MG tablet Take 1 tablet (7.5 mg total) by mouth daily. 09/30/19   Sakara Lehtinen, Corene Cornea, MD  naproxen (NAPROSYN) 500 MG tablet Take by mouth. 06/16/19   [provider]  nortriptyline (PAMELOR) 10 MG capsule TAKE 1 CAPSULE (10 MG TOTAL) BY MOUTH AT BEDTIME. 05/20/19   [provider]  rizatriptan (MAXALT-MLT) 10 MG disintegrating tablet Take 1 tablet (10 mg total) by mouth as needed for migraine. May repeat in 2 hours if needed 03/18/19   Melvenia Beam, MD  Vitamin D, Ergocalciferol, (DRISDOL) 1.25 MG (50000 UNIT) CAPS capsule Take 50,000 Units by mouth once a week. 05/28/19   [provider]  FLUoxetine (PROZAC) 10 MG capsule  Take by mouth. 07/30/19 09/30/19  [provider]    Allergies    Amoxicillin, Cefaclor, and Cefuroxime axetil  Review of Systems   Review of Systems  All other systems reviewed and are negative.   Physical Exam Updated Vital Signs BP 119/81 (BP Location: Left Arm)   Pulse 74   Temp 98.8 F (37.1 C) (Oral)   Resp 14   Ht 5\' 1"  (1.549 m)   Wt 106.6 kg   LMP 09/26/2019 (Exact Date)   SpO2 97%   BMI 44.40 kg/m   Physical Exam Vitals and nursing note reviewed.  Constitutional:      Appearance: She is well-developed.  HENT:     Head: Normocephalic and atraumatic.     Nose: No congestion or rhinorrhea.     Mouth/Throat:     Mouth: Mucous membranes are moist.     Pharynx: Oropharynx is clear.  Eyes:     Pupils: Pupils are equal, round, and reactive to light.  Cardiovascular:     Rate and Rhythm: Normal rate and regular rhythm.  Pulmonary:     Effort: No respiratory distress.     Breath sounds: No stridor.  Abdominal:     General: There is no distension.  Musculoskeletal:        General: No swelling or tenderness. Normal range of motion.     Cervical back: Normal range of motion.     Right lower leg: No edema.     Left lower leg: No edema.  Skin:    General: Skin is warm.  Neurological:     General: No focal deficit present.     Mental Status: She is alert.     ED Results / Procedures / Treatments   Labs (all labs ordered are listed, but only abnormal results are displayed) Labs Reviewed  BASIC METABOLIC PANEL - Abnormal; Notable for the following components:      Result Value   Glucose, Bld 119 (*)    Calcium 8.6 (*)    All other components within normal limits  CBC WITH DIFFERENTIAL/PLATELET  PREGNANCY, URINE  D-DIMER, QUANTITATIVE (NOT AT [facility])    EKG EKG Interpretation  Date/Time:  Wednesday September 30 2019 03:32:54 EDT Ventricular Rate:  74 PR Interval:    QRS Duration: 93 QT Interval:  389 QTC Calculation: 432 R Axis:   75 Text  Interpretation: Sinus rhythm Atrial premature complex Baseline wander in lead(s) V4 No significant change since last tracing Confirmed by Merrily Pew [phone]) on 09/30/2019 4:24:26 AM   Radiology DG Chest Portable 1 View  Result Date: 09/30/2019 CLINICAL DATA:  Back pain EXAM: PORTABLE CHEST 1 VIEW  COMPARISON:  None. FINDINGS: The heart size and mediastinal contours are within normal limits. Both lungs are clear. The visualized skeletal structures are unremarkable. IMPRESSION: No active disease. Electronically Signed   By: Prudencio Pair M.D.   On: 09/30/2019 03:55    Procedures Procedures (including critical care time)  Medications Ordered in ED Medications - No data to display  ED Course  I have reviewed the triage vital signs and the nursing notes.  Pertinent labs & imaging results that were available during my care of the patient were reviewed by me and considered in my medical decision making (see chart for details).    MDM Rules/Calculators/A&P                          D-dimer negative I doubt she has a DVT or pulmonary embolus.  No indication for imaging or DVT study.  The very well could just be cramping versus some other type of muscular soft tissue pain.  Low suspicion for any bony injury without trauma.  No obvious bruising on exam.  Patient can follow-up with her PCP for continued management of her sciatica and this discomfort.  Final Clinical Impression(s) / ED Diagnoses Final diagnoses:  Left leg pain  Acute left-sided low back pain with left-sided sciatica    Rx / DC Orders ED Discharge Orders         Ordered    famotidine (PEPCID) 40 MG tablet  As needed     Discontinue  Reprint     09/30/19 0318    ketorolac (TORADOL) 10 MG tablet  Every 6 hours PRN     Discontinue  Reprint     09/30/19 0318    cyclobenzaprine (FLEXERIL) 10 MG tablet  2 times daily PRN     Discontinue  Reprint     09/30/19 0432    meloxicam (MOBIC) 7.5 MG tablet  Daily     Discontinue  Reprint      09/30/19 0432           Ruhaan Nordahl, Corene Cornea, MD 09/30/19 [phone]

## 2019-10-16 ENCOUNTER — Other Ambulatory Visit: Payer: Self-pay

## 2019-10-16 ENCOUNTER — Encounter: Payer: Self-pay | Admitting: Physical Therapy

## 2019-10-16 ENCOUNTER — Ambulatory Visit: Payer: Medicaid Other | Attending: Otolaryngology | Admitting: Physical Therapy

## 2019-10-16 DIAGNOSIS — M5432 Sciatica, left side: Secondary | ICD-10-CM | POA: Diagnosis not present

## 2019-10-16 DIAGNOSIS — R42 Dizziness and giddiness: Secondary | ICD-10-CM | POA: Diagnosis present

## 2019-10-16 DIAGNOSIS — R293 Abnormal posture: Secondary | ICD-10-CM
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
## 2019-10-21 ENCOUNTER — Ambulatory Visit: Payer: Medicaid Other | Admitting: Family Medicine

## 2019-10-22 ENCOUNTER — Telehealth: Payer: Self-pay

## 2019-10-22 NOTE — Telephone Encounter (Signed)
I called pt and she is has been taking at night, but as works as a Programme researcher, broadcasting/film/video she notes increase since taking.  She said will try to take earlier to see at 2000 since its takes a little bit to kick in to sleep.  Made appt for discuss other option 800 on Monday 10-26-19.  I relayed that takes up to 6 wks for medication to make good benefit.  Unfortunately the medications will all have SE, but it is a trial and error as every person is difference in metabolizing and how it affects them.  She verbalized understanding.

## 2019-10-22 NOTE — Telephone Encounter (Signed)
I would tell her that nortriptyline can make folks feel a little dizzy in the beginning, however, it takes about 4-6 weeks to be effective in headaches. If dizziness is similar to what she was experiencing before, it may not be related to medication. It is hard to say for sure. If dizziness is mind and she feels that she can push through, I would have her take it at night with a light snack. If dizziness is troublesome and she can not tolerate it, have her discontinue medication. She can reschedule follow up to discuss other treatment options pending symptoms. TY.

## 2019-10-22 NOTE — Telephone Encounter (Signed)
Patient had to cancel her follow up with Amy on the 11th and was calling today to reschedule. She was wanting to reschedule with Dr. Jaynee Eagles but I advised her that Dr. Ferdinand Lango next available is November 1st. She states that Amy had prescribed nortriptyline for her and she just started it 2 days ago and since starting it her dizziness has increased and makes it very hard to function. She would like to discuss what she should do.

## 2019-10-23 ENCOUNTER — Encounter: Payer: Self-pay | Admitting: Physical Therapy

## 2019-10-23 ENCOUNTER — Ambulatory Visit: Payer: Medicaid Other | Admitting: Physical Therapy

## 2019-10-23 ENCOUNTER — Other Ambulatory Visit: Payer: Self-pay

## 2019-10-23 DIAGNOSIS — M5432 Sciatica, left side: Secondary | ICD-10-CM

## 2019-10-23 DIAGNOSIS — R293 Abnormal posture: Secondary | ICD-10-CM
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
## 2019-10-26 ENCOUNTER — Ambulatory Visit (INDEPENDENT_AMBULATORY_CARE_PROVIDER_SITE_OTHER): Payer: Medicaid Other | Admitting: Family Medicine

## 2019-10-26 ENCOUNTER — Other Ambulatory Visit: Payer: Self-pay

## 2019-10-26 ENCOUNTER — Encounter: Payer: Self-pay | Admitting: Family Medicine

## 2019-10-26 VITALS — BP 138/91 | HR 80 | Ht 61.0 in | Wt 244.0 lb

## 2019-10-26 DIAGNOSIS — G43109 Migraine with aura, not intractable, without status migrainosus: Secondary | ICD-10-CM | POA: Diagnosis not present

## 2019-10-26 DIAGNOSIS — R42 Dizziness and giddiness: Secondary | ICD-10-CM | POA: Diagnosis not present

## 2019-10-26 MED ORDER — PROPRANOLOL HCL 10 MG PO TABS: 10.0000 mg | ORAL_TABLET | Freq: Two times a day (BID) | ORAL | 11 refills | Status: DC
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
## 2019-10-26 NOTE — Patient Instructions (Signed)
We will try propranolol at a low dose, 10mg  twice daily, for headache prevention. Start with 1 tablet once daily for 1 week. If well tolerated, increase to 1 tablet twice daily. Monitor blood pressure at home. Continue losartan as directed by PCP.   Please increase your water intake. Dehydration is a very common cause of headaches and dizziness. I recommend drinking 50-60 ounces of water daily.   Continue to work with PCP for neck pain.   Follow up with me in 3-6 months    Propranolol Tablets What is this medicine? PROPRANOLOL (proe PRAN oh lole) is a beta blocker. It decreases the amount of work your heart has to do and helps your heart beat regularly. It treats high blood pressure and/or prevent chest pain (also called angina). It is also used after a heart attack to prevent a second one. This medicine may be used for other purposes; ask your health care provider or pharmacist if you have questions. COMMON BRAND NAME(S): Inderal What should I tell my health care provider before I take this medicine? They need to know if you have any of these conditions:  circulation problems or blood vessel disease  diabetes  history of heart attack or heart disease, vasospastic angina  kidney disease  liver disease  lung or breathing disease, like asthma or emphysema  pheochromocytoma  slow heart rate  thyroid disease  an unusual or allergic reaction to propranolol, other beta-blockers, medicines, foods, dyes, or preservatives  pregnant or trying to get pregnant  breast-feeding How should I use this medicine? Take this drug by mouth. Take it as directed on the prescription label at the same time every day. Keep taking it unless your health care provider tells you to stop. Talk to your health care provider about the use of this drug in children. Special care may be needed. Overdosage: If you think you have taken too much of this medicine contact a poison control center or emergency room  at once. NOTE: This medicine is only for you. Do not share this medicine with others. What if I miss a dose? If you miss a dose, take it as soon as you can. If it is almost time for your next dose, take only that dose. Do not take double or extra doses. What may interact with this medicine? Do not take this medicine with any of the following medications:  feverfew  phenothiazines like chlorpromazine, mesoridazine, prochlorperazine, thioridazine This medicine may also interact with the following medications:  aluminum hydroxide gel  antipyrine  antiviral medicines for HIV or AIDS  barbiturates like phenobarbital  certain medicines for blood pressure, heart disease, irregular heart beat  cimetidine  ciprofloxacin  diazepam  fluconazole  haloperidol  isoniazid  medicines for cholesterol like cholestyramine or colestipol  medicines for mental depression  medicines for migraine headache like almotriptan, eletriptan, frovatriptan, naratriptan, rizatriptan, sumatriptan, zolmitriptan  NSAIDs, medicines for pain and inflammation, like ibuprofen or naproxen  phenytoin  rifampin  teniposide  theophylline  thyroid medicines  tolbutamide  warfarin  zileuton This list may not describe all possible interactions. Give your health care provider a list of all the medicines, herbs, non-prescription drugs, or dietary supplements you use. Also tell them if you smoke, drink alcohol, or use illegal drugs. Some items may interact with your medicine. What should I watch for while using this medicine? Visit your doctor or health care professional for regular check ups. Check your blood pressure and pulse rate regularly. Ask your health care professional  what your blood pressure and pulse rate should be, and when you should contact them. You may get drowsy or dizzy. Do not drive, use machinery, or do anything that needs mental alertness until you know how this drug affects you. Do  not stand or sit up quickly, especially if you are an older patient. This reduces the risk of dizzy or fainting spells. Alcohol can make you more drowsy and dizzy. Avoid alcoholic drinks. This medicine may increase blood sugar. Ask your healthcare provider if changes in diet or medicines are needed if you have diabetes. Do not treat yourself for coughs, colds, or pain while you are taking this medicine without asking your doctor or health care professional for advice. Some ingredients may increase your blood pressure. What side effects may I notice from receiving this medicine? Side effects that you should report to your doctor or health care professional as soon as possible:  allergic reactions like skin rash, itching or hives, swelling of the face, lips, or tongue  breathing problems  cold hands or feet  difficulty sleeping, nightmares  dry peeling skin  hallucinations  muscle cramps or weakness   signs and symptoms of high blood sugar such as being more thirsty or hungry or having to urinate more than normal. You may also feel very tired or have blurry vision.  slow heart rate  swelling of the legs and ankles  vomiting Side effects that usually do not require medical attention (report to your doctor or health care professional if they continue or are bothersome):  change in sex drive or performance  diarrhea  dry sore eyes  hair loss  nausea  weak or tired This list may not describe all possible side effects. Call your doctor for medical advice about side effects. You may report side effects to FDA at 1-800-FDA-1088. Where should I keep my medicine? Keep out of the reach of children and pets. Store at room temperature between 20 and 25 degrees C (68 and 77 degrees F). Protect from light. Throw away any unused drug after the expiration date. NOTE: This sheet is a summary. It may not cover all possible information. If you have questions about this medicine, talk to your  doctor, pharmacist, or health care provider.  2020 Elsevier/Gold Standard (2018-10-03 19:25:51)   Migraine Headache A migraine headache is a very strong throbbing pain on one side or both sides of your head. This type of headache can also cause other symptoms. It can last from 4 hours to 3 days. Talk with your doctor about what things may bring on (trigger) this condition. What are the causes? The exact cause of this condition is not known. This condition may be triggered or caused by:  Drinking alcohol.  Smoking.  Taking medicines, such as: ? Medicine used to treat chest pain (nitroglycerin). ? Birth control pills. ? Estrogen. ? Some blood pressure medicines.  Eating or drinking certain products.  Doing physical activity. Other things that may trigger a migraine headache include:  Having a menstrual period.  Pregnancy.  Hunger.  Stress.  Not getting enough sleep or getting too much sleep.  Weather changes.  Tiredness (fatigue). What increases the risk?  Being 93-32 years old.  Being female.  Having a family history of migraine headaches.  Being Caucasian.  Having depression or anxiety.  Being very overweight. What are the signs or symptoms?  A throbbing pain. This pain may: ? Happen in any area of the head, such as on one side or  both sides. ? Make it hard to do daily activities. ? Get worse with physical activity. ? Get worse around bright lights or loud noises.  Other symptoms may include: ? Feeling sick to your stomach (nauseous). ? Vomiting. ? Dizziness. ? Being sensitive to bright lights, loud noises, or smells.  Before you get a migraine headache, you may get warning signs (an aura). An aura may include: ? Seeing flashing lights or having blind spots. ? Seeing bright spots, halos, or zigzag lines. ? Having tunnel vision or blurred vision. ? Having numbness or a tingling feeling. ? Having trouble talking. ? Having weak muscles.  Some  people have symptoms after a migraine headache (postdromal phase), such as: ? Tiredness. ? Trouble thinking (concentrating). How is this treated?  Taking medicines that: ? Relieve pain. ? Relieve the feeling of being sick to your stomach. ? Prevent migraine headaches.  Treatment may also include: ? Having acupuncture. ? Avoiding foods that bring on migraine headaches. ? Learning ways to control your body functions (biofeedback). ? Therapy to help you know and deal with negative thoughts (cognitive behavioral therapy). Follow these instructions at home: Medicines  Take over-the-counter and prescription medicines only as told by your doctor.  Ask your doctor if the medicine prescribed to you: ? Requires you to avoid driving or using heavy machinery. ? Can cause trouble pooping (constipation). You may need to take these steps to prevent or treat trouble pooping:  Drink enough fluid to keep your pee (urine) pale yellow.  Take over-the-counter or prescription medicines.  Eat foods that are high in fiber. These include beans, whole grains, and fresh fruits and vegetables.  Limit foods that are high in fat and sugar. These include fried or sweet foods. Lifestyle  Do not drink alcohol.  Do not use any products that contain nicotine or tobacco, such as cigarettes, e-cigarettes, and chewing tobacco. If you need help quitting, ask your doctor.  Get at least 8 hours of sleep every night.  Limit and deal with stress. General instructions      Keep a journal to find out what may bring on your migraine headaches. For example, write down: ? What you eat and drink. ? How much sleep you get. ? Any change in what you eat or drink. ? Any change in your medicines.  If you have a migraine headache: ? Avoid things that make your symptoms worse, such as bright lights. ? It may help to lie down in a dark, quiet room. ? Do not drive or use heavy machinery. ? Ask your doctor what  activities are safe for you.  Keep all follow-up visits as told by your doctor. This is important. Contact a doctor if:  You get a migraine headache that is different or worse than others you have had.  You have more than 15 headache days in one month. Get help right away if:  Your migraine headache gets very bad.  Your migraine headache lasts longer than 72 hours.  You have a fever.  You have a stiff neck.  You have trouble seeing.  Your muscles feel weak or like you cannot control them.  You start to lose your balance a lot.  You start to have trouble walking.  You pass out (faint).  You have a seizure. Summary  A migraine headache is a very strong throbbing pain on one side or both sides of your head. These headaches can also cause other symptoms.  This condition may be treated with  medicines and changes to your lifestyle.  Keep a journal to find out what may bring on your migraine headaches.  Contact a doctor if you get a migraine headache that is different or worse than others you have had.  Contact your doctor if you have more than 15 headache days in a month. This information is not intended to replace advice given to you by your health care provider. Make sure you discuss any questions you have with your health care provider. Document Revised: 06/20/2018 Document Reviewed: 04/10/2018 Elsevier Patient Education  [place].

## 2019-10-29 ENCOUNTER — Other Ambulatory Visit: Payer: Self-pay

## 2019-10-29 ENCOUNTER — Ambulatory Visit: Payer: Medicaid Other | Admitting: Physical Therapy

## 2019-10-29 DIAGNOSIS — R293 Abnormal posture: Secondary | ICD-10-CM

## 2019-10-29 DIAGNOSIS — M5432 Sciatica, left side: Secondary | ICD-10-CM

## 2019-10-29 DIAGNOSIS — R42 Dizziness and giddiness: Secondary | ICD-10-CM

## 2019-10-29 NOTE — Patient Instructions (Signed)
Tennis [employer] tennis ball under left buttocks at most tender spot.  Rock pelvis side to side to move tennis ball along tender muscle deep in left buttocks.     NECK/BACK/CHEST STRETCHES Access Code: [email or web address] URL: [email or web address] Date: 09/21/2019 Prepared by: Misty Stanley  Exercises Seated Upper Trapezius Stretch - 2-3 x daily - 7 x weekly - 2-3 reps - 1 sets - 20-30 secs hold Seated Levator Scapulae Stretch - 2-3 x daily - 7 x weekly - 3 sets - 20 second hold Standing Shoulder External Rotation Stretch in Doorway - 1 x daily - 7 x weekly - 2 sets - 20 second hold Child's Pose with Sidebending - 1 x daily - 7 x weekly - 3 sets - 20 second hold  SCIATICA EXERCISES Access Code: [facility] URL: [email or web address] Date: 09/21/2019 Prepared by: Misty Stanley  Program Notes Hold stretches for 30 seconds and continue to take deep, slow breaths - inhale slowly for 4 seconds and then exhale slowly for 4 seconds.  Exercises Hooklying Hamstring Stretch with Strap - 1 x daily - 7 x weekly - 2 sets - 30 second hold Supine ITB Stretch with Strap - 1 x daily - 7 x weekly - 2 sets - 30 second hold Seated Piriformis Stretch - 1 x daily - 7 x weekly - 2 sets - 30 second hold Seated Slump Nerve Glide - 1 x daily - 7 x weekly - 2 sets - 10 reps Standing Gastroc Stretch on Step with Counter Support - 1 x daily - 7 x weekly - 2 sets - 30 seconds hold   ELBOW EXERCISES Access Code: [email or web address] URL: [email or web address] Date: 07/08/2019 Prepared by: Misty Stanley  Exercises Seated Wrist Flexion with Overpressure - 1 x daily - 7 x weekly - 5 reps - 30 seconds hold Wrist Flexion Active Stretch in Neutral - 1 x daily - 7 x weekly - 5 reps - 30 seconds hold Seated Isometric Wrist Flexion Supinated with Manual Resistance - 1 x daily - 7 x weekly - 10 reps - 1 sets - 6 second hold
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
## 2019-11-02 ENCOUNTER — Encounter: Payer: Self-pay | Admitting: Family Medicine

## 2019-11-02 MED ORDER — AJOVY 225 MG/1.5ML ~~LOC~~ SOAJ: 225.0000 mg | SUBCUTANEOUS | 11 refills | Status: DC

## 2019-11-04 ENCOUNTER — Ambulatory Visit: Payer: Medicaid Other | Admitting: Rehabilitation

## 2019-11-04 ENCOUNTER — Encounter: Payer: Self-pay | Admitting: Rehabilitation

## 2019-11-04 ENCOUNTER — Telehealth: Payer: Self-pay

## 2019-11-04 ENCOUNTER — Other Ambulatory Visit: Payer: Self-pay

## 2019-11-04 DIAGNOSIS — R293 Abnormal posture: Secondary | ICD-10-CM

## 2019-11-04 DIAGNOSIS — M5432 Sciatica, left side: Secondary | ICD-10-CM

## 2019-11-04 NOTE — Telephone Encounter (Signed)
A Pa has been sent via [facility] For Ajovy 225 mg  Key: [id number] - Rx #: [id number]  Status pending
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
## 2019-11-05 MED ORDER — ERENUMAB-AOOE 140 MG/ML ~~LOC~~ SOAJ
140.0000 mg | SUBCUTANEOUS | 11 refills | Status: DC
Start: 2019-11-05 — End: 2020-07-21

## 2019-11-05 NOTE — Telephone Encounter (Addendum)
Aimovig PA, key: [id number], [id number], Your information has been sent to [facility].

## 2019-11-05 NOTE — Addendum Note (Signed)
Addended by: Debbora Presto L on: 11/05/2019 03:13 PM   Modules accepted: Orders

## 2019-11-05 NOTE — Telephone Encounter (Addendum)
Answered clinical questions on CMM for Ajovy PA, Tried and failed drugs: topamax,imitrex, flexeril, meclizine, losartan.  Your information has been sent to [facility].

## 2019-11-05 NOTE — Telephone Encounter (Signed)
Ajovy denied,Denied. The drug that you asked for is not covered. We are denying your request because we do not show that you have tried at least 2 preferred drugs. Other covered drug(s) is/are: Aimovig Solution Auto-Injector , Emgality (300 Mg Dose), Emgality Solution Auto-Injector 120 Mg/Ml , Emgality Solution Prefilled Syringe 120 Mg/Ml ). Note: Some covered drug(s) may have limits on the quantity you can get. Additional clinical requirements include: 1. Beneficiary has a diagnosis of migraine with or without aura based on International Classification of Headache Disorders criteria; 2. Beneficiary is 35 years old or older; 3. Beneficiary does not have medication over-use headache ([place]); 4. Beneficiaries that are women of childbearing age have had a negative pregnancy test at baseline; 5. Beneficiary has 4 or more migraine days per month for at least 3 months; 6. Beneficiary is utilizing prophylactic intervention modalities (e.g. behavioral therapy, physical therapy, life-style modifications) ; 7. Beneficiary has tried and failed at least a month or greater trial of medications from at least 2 different classes from the following list of oral medications: a. Antidepressants (e.g. amitriptyline, venlafaxine) b. Beta Blockers (e.g. propranolol, metoprolol, timolol, atenolol) c. Anti-epileptics (e.g. valproate, topiramate) d. Angiotensin converting enzyme inhibitors/angiotensin II receptor blockers (e.g. lisinopril, candesartan) e. Calcium Channel Blockers (e.g. verapamil, nimodipine)

## 2019-11-09 ENCOUNTER — Encounter: Payer: Self-pay | Admitting: *Deleted

## 2019-11-09 NOTE — Telephone Encounter (Addendum)
Per CMM: Aimovig  Denied. We are unable to approve your request. We reviewed your case. We need the following: 1. Beneficiaries that are women of childbearing age have had a negative pregnancy test at baseline; 2. Beneficiary has 4 or more migraine days per month for at least 3 months; 3. Beneficiary is utilizing prophylactic intervention modalities (e.g. behavioral therapy, physical therapy, life-style modifications) ; 4. Beneficiary has tried and failed at least a month or greater trial of medications from at least 2 different classes from the following list of oral medications: a. Antidepressants (e.g. amitriptyline, venlafaxine) b. Beta Blockers (e.g. propranolol, metoprolol, timolol, atenolol) c. Anti-epileptics (e.g. valproate, topiramate) d. Angiotensin converting enzyme inhibitors/angiotensin II receptor blockers (e.g. lisinopril, candesartan) e. Calcium Channel Blockers (e.g. verapamil, nimodipine). We asked your doctor for this information.

## 2019-11-09 NOTE — Telephone Encounter (Signed)
Sent my chart asking patient if she wants to try Ajovy discount card or get new Rx.

## 2019-11-09 NOTE — Telephone Encounter (Signed)
I have gone back to look through her chart. I can add that she has tried and failed topiramate and Prozac. Not sure if this will satisfy need. She can try copay card if she would like, otherwise we can add an antiseizure agent like low dose divalproex.

## 2019-11-12 ENCOUNTER — Other Ambulatory Visit: Payer: Self-pay

## 2019-11-12 ENCOUNTER — Encounter: Payer: Self-pay | Admitting: Physical Therapy

## 2019-11-12 ENCOUNTER — Ambulatory Visit: Payer: Medicaid Other | Attending: Otolaryngology | Admitting: Physical Therapy

## 2019-11-12 DIAGNOSIS — M5432 Sciatica, left side: Secondary | ICD-10-CM

## 2019-11-12 DIAGNOSIS — M542 Cervicalgia: Secondary | ICD-10-CM | POA: Diagnosis present

## 2019-11-12 DIAGNOSIS — R293 Abnormal posture: Secondary | ICD-10-CM | POA: Diagnosis present

## 2019-11-12 NOTE — Patient Instructions (Addendum)
°  Access Code: [facility] URL: [email or web address] Date: 11/12/2019 Prepared by: Misty Stanley  Program Notes Hold stretches for 30 seconds and continue to take deep, slow breaths - inhale slowly for 4 seconds and then exhale slowly for 4 seconds.   Exercises Hooklying Hamstring Stretch with Strap - 1 x daily - 7 x weekly - 2 sets - 30 second hold Supine ITB Stretch with Strap - 1 x daily - 7 x weekly - 2 sets - 30 second hold Seated Piriformis Stretch - 1 x daily - 7 x weekly - 2 sets - 30 second hold Seated Slump Nerve Glide - 1 x daily - 7 x weekly - 2 sets - 10 reps Standing Gastroc Stretch on Step with Counter Support - 1 x daily - 7 x weekly - 2 sets - 30 seconds hold Prone Hip Flexor Stretch on Table with Strap - 1 x daily - 7 x weekly - 2 sets - 30 second hold
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
## 2019-11-18 ENCOUNTER — Encounter: Payer: Self-pay | Admitting: Physical Therapy

## 2019-11-18 ENCOUNTER — Other Ambulatory Visit: Payer: Self-pay

## 2019-11-18 ENCOUNTER — Ambulatory Visit: Payer: Medicaid Other | Admitting: Physical Therapy

## 2019-11-18 DIAGNOSIS — M5432 Sciatica, left side: Secondary | ICD-10-CM | POA: Diagnosis not present

## 2019-11-18 DIAGNOSIS — R293 Abnormal posture: Secondary | ICD-10-CM

## 2019-11-18 DIAGNOSIS — M542 Cervicalgia: Secondary | ICD-10-CM

## 2019-11-18 NOTE — Patient Instructions (Signed)
°-  Try 2 to 3 times a week to walk the loops for 10-15 minutes for aerobic exercise.  Stretch your calf after walking.    Access Code: [facility] URL: [email or web address] Date: 11/12/2019 Prepared by: Misty Stanley  Program Notes Hold stretches for 30 seconds and continue to take deep, slow breaths - inhale slowly for 4 seconds and then exhale slowly for 4 seconds.   Exercises Hooklying Hamstring Stretch with Strap - 1 x daily - 7 x weekly - 2 sets - 30 second hold Supine ITB Stretch with Strap - 1 x daily - 7 x weekly - 2 sets - 30 second hold Seated Piriformis Stretch - 1 x daily - 7 x weekly - 2 sets - 30 second hold Seated Slump Nerve Glide - 1 x daily - 7 x weekly - 2 sets - 10 reps Standing Gastroc Stretch on Step with Counter Support - 1 x daily - 7 x weekly - 2 sets - 30 seconds hold Prone Hip Flexor Stretch on Table with Strap - 1 x daily - 7 x weekly - 2 sets - 30 second hold
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
## 2019-11-19 ENCOUNTER — Encounter: Payer: Self-pay | Admitting: Podiatry

## 2019-11-19 ENCOUNTER — Ambulatory Visit: Payer: Medicaid Other | Admitting: Podiatry

## 2019-11-19 DIAGNOSIS — B070 Plantar wart: Secondary | ICD-10-CM | POA: Diagnosis not present

## 2019-11-19 MED ORDER — FLUOROURACIL 5 % EX CREA: TOPICAL_CREAM | Freq: Two times a day (BID) | CUTANEOUS | 2 refills | Status: DC

## 2019-11-19 NOTE — Progress Notes (Signed)
Subjective:   Patient ID: Rebecca Blackburn, female   DOB: 34 y.o.   MRN: [id number]   HPI Patient presents with severe lesion plantar right that continues to be bothersome for her and seemed to improve slightly but is hurting again   ROS      Objective:  Physical Exam  Neurovascular status intact with keratotic lesion subfourth metatarsal and distal right that is painful when pressed     Assessment:  Strong possibility this is still verruca plantaris but cannot rule out other pathology     Plan:  Sharp sterile debridement accomplished iatrogenic bleeding and went ahead and applied chemical agent to create immune response and placed on Efudex for home usage and discussed coming back whenever pain should occur again with sterile dressing applied today and instructed what to do if any blistering were to occur

## 2019-11-30 ENCOUNTER — Ambulatory Visit: Payer: Medicaid Other | Admitting: Rehabilitation

## 2019-12-11 ENCOUNTER — Ambulatory Visit: Payer: Medicaid Other | Admitting: Physical Therapy

## 2019-12-17 ENCOUNTER — Other Ambulatory Visit: Payer: Self-pay

## 2019-12-17 ENCOUNTER — Encounter: Payer: Self-pay | Admitting: Podiatry

## 2019-12-17 ENCOUNTER — Ambulatory Visit (INDEPENDENT_AMBULATORY_CARE_PROVIDER_SITE_OTHER): Payer: Medicaid Other | Admitting: Podiatry

## 2019-12-17 DIAGNOSIS — B070 Plantar wart: Secondary | ICD-10-CM | POA: Diagnosis not present

## 2019-12-19 NOTE — Progress Notes (Signed)
Subjective:   Patient ID: Rebecca Blackburn, female   DOB: 34 y.o.   MRN: [id number]   HPI Patient states still having pain plantar aspect right foot with lesion but thinks that it improved   ROS      Objective:  Physical Exam  Neurovascular status intact with lesion right that upon debridement shows pinpoint bleeding but appears to be thinner than previously     Assessment:  Verruca plantaris plantar right with improvement coming     Plan:  H&P reviewed condition sterile sharp debridement of the lesion accomplished applied medication to create immune response discussed continued conservative care as needed and patient will be seen back to recheck

## 2019-12-25 ENCOUNTER — Encounter: Payer: Self-pay | Admitting: Physical Therapy

## 2019-12-25 ENCOUNTER — Other Ambulatory Visit: Payer: Self-pay

## 2019-12-25 ENCOUNTER — Ambulatory Visit: Payer: Medicaid Other | Attending: Otolaryngology | Admitting: Physical Therapy

## 2019-12-25 DIAGNOSIS — M5432 Sciatica, left side: Secondary | ICD-10-CM

## 2019-12-25 DIAGNOSIS — R293 Abnormal posture: Secondary | ICD-10-CM | POA: Diagnosis present
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
## 2020-01-04 DIAGNOSIS — J342 Deviated nasal septum: Secondary | ICD-10-CM | POA: Insufficient documentation

## 2020-01-04 DIAGNOSIS — H6993 Unspecified Eustachian tube disorder, bilateral: Secondary | ICD-10-CM | POA: Insufficient documentation

## 2020-01-04 DIAGNOSIS — G8929 Other chronic pain: Secondary | ICD-10-CM | POA: Insufficient documentation

## 2020-01-11 ENCOUNTER — Ambulatory Visit: Payer: Medicaid Other | Admitting: Physical Therapy

## 2020-01-25 ENCOUNTER — Encounter: Payer: Self-pay | Admitting: Physical Therapy

## 2020-01-25 ENCOUNTER — Other Ambulatory Visit: Payer: Self-pay

## 2020-01-25 ENCOUNTER — Ambulatory Visit: Payer: Medicaid Other | Attending: Otolaryngology | Admitting: Physical Therapy

## 2020-01-25 DIAGNOSIS — M25521 Pain in right elbow: Secondary | ICD-10-CM | POA: Diagnosis present

## 2020-01-25 DIAGNOSIS — R293 Abnormal posture: Secondary | ICD-10-CM | POA: Insufficient documentation

## 2020-01-25 DIAGNOSIS — M5432 Sciatica, left side: Secondary | ICD-10-CM | POA: Diagnosis present

## 2020-01-25 DIAGNOSIS — R42 Dizziness and giddiness: Secondary | ICD-10-CM | POA: Diagnosis present

## 2020-01-25 DIAGNOSIS — M542 Cervicalgia: Secondary | ICD-10-CM | POA: Insufficient documentation
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
## 2020-01-25 NOTE — Patient Instructions (Signed)
-  In January, talk to your primary care about writing a new referral to include aquatic therapy.  Have them send it here (Neurorehab) and then call and we can get you set up with an evaluation for aquatic therapy.    -Try 2 to 3 times a week to walk the loops for 10-15 minutes for aerobic exercise.  Stretch your calf after walking.  Access Code: [facility] URL: [email or web address] Date: 11/12/2019 Prepared by: Misty Stanley  Program Notes Hold stretches for 30 seconds and continue to take deep, slow breaths - inhale slowly for 4 seconds and then exhale slowly for 4 seconds.  Exercises Hooklying Hamstring Stretch with Strap - 1 x daily - 7 x weekly - 2 sets - 30 second hold Supine ITB Stretch with Strap - 1 x daily - 7 x weekly - 2 sets - 30 second hold Seated Piriformis Stretch - 1 x daily - 7 x weekly - 2 sets - 30 second hold Seated Slump Nerve Glide - 1 x daily - 7 x weekly - 2 sets - 10 reps Standing Gastroc Stretch on Step with Counter Support - 1 x daily - 7 x weekly - 2 sets - 30 seconds hold Prone Hip Flexor Stretch on Table with Strap - 1 x daily - 7 x weekly - 2 sets - 30 second hold

## 2020-01-26 ENCOUNTER — Ambulatory Visit: Payer: Medicaid Other | Admitting: Family Medicine

## 2020-01-28 DIAGNOSIS — L84 Corns and callosities: Secondary | ICD-10-CM | POA: Insufficient documentation

## 2020-01-31 ENCOUNTER — Emergency Department (HOSPITAL_COMMUNITY)
Admission: EM | Admit: 2020-01-31 | Discharge: 2020-01-31 | Disposition: A | Discharge status: Home / Self Care | Payer: Medicaid Other | Attending: Emergency Medicine | Admitting: Emergency Medicine

## 2020-01-31 ENCOUNTER — Other Ambulatory Visit: Payer: Self-pay

## 2020-01-31 ENCOUNTER — Encounter (HOSPITAL_COMMUNITY): Payer: Self-pay | Admitting: Emergency Medicine

## 2020-01-31 DIAGNOSIS — J45909 Unspecified asthma, uncomplicated: Secondary | ICD-10-CM | POA: Diagnosis not present

## 2020-01-31 DIAGNOSIS — M5432 Sciatica, left side: Secondary | ICD-10-CM

## 2020-01-31 DIAGNOSIS — I10 Essential (primary) hypertension: Secondary | ICD-10-CM | POA: Insufficient documentation

## 2020-01-31 DIAGNOSIS — Z79899 Other long term (current) drug therapy: Secondary | ICD-10-CM | POA: Insufficient documentation

## 2020-01-31 DIAGNOSIS — Z7951 Long term (current) use of inhaled steroids: Secondary | ICD-10-CM | POA: Diagnosis not present

## 2020-01-31 DIAGNOSIS — M545 Low back pain, unspecified: Secondary | ICD-10-CM | POA: Diagnosis present

## 2020-01-31 MED ORDER — KETOROLAC TROMETHAMINE 10 MG PO TABS: 10.0000 mg | ORAL_TABLET | Freq: Three times a day (TID) | ORAL | 0 refills | Status: DC | PRN

## 2020-01-31 MED ORDER — DEXAMETHASONE SODIUM PHOSPHATE 10 MG/ML IJ SOLN
10.0000 mg | Freq: Once | INTRAMUSCULAR | Status: AC
  Administered 2020-01-31: 10 mg via INTRAMUSCULAR
  Filled 2020-01-31: qty 1

## 2020-01-31 NOTE — ED Provider Notes (Signed)
[facility] DEPT Provider Note   CSN: [id number] Arrival date & time: 01/31/20  1839     History Chief Complaint  Patient presents with  . Back Pain    Rebecca Blackburn is a 34 y.o. female.  HPI   Patient is a 34 year old female who presents to the emergency department with back pain.  She reports a significant history of sciatica and states that her symptoms are consistent with prior sciatica exacerbations.  Her pain started worsening about 3 days ago.  She works as a Programme researcher, broadcasting/film/video in [employer] and does a lot of ambulating and heavy lifting.  Patient states she took Toradol and Zanaflex this morning which provided moderate relief of her symptoms.  She reports a burning radiation of her pain into her left thigh.  This is intermittent.  No saddle anesthesia, weakness, bowel or bladder incontinence.     Past Medical History:  Diagnosis Date  . Asthma   . High blood pressure   . Migraine   . Varicella     Patient Active Problem List   Diagnosis Date Noted  . Migraine with aura and without status migrainosus, not intractable 03/18/2019  . Seborrheic dermatitis 02/13/2019  . Dizziness 01/13/2019  . 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 ASCUS of cervix with negative high risk HPV 11/20/2018  . Encounter for gynecological examination without abnormal finding 11/07/2018  . Genital warts 09/30/2018  . History of loop electrical excision procedure (LEEP) 09/30/2018  . 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 Essential hypertension 01/23/2018  . GERD with esophagitis 01/23/2018  . Migraine without aura and without status migrainosus, not intractable 01/23/2018  . Morbid obesity due to excess calories ([place]) 01/23/2018    Past Surgical History:  Procedure Laterality Date  . CESAREAN SECTION     2016 and 2006  . SKIN BIOPSY    . TONSILLECTOMY       OB History   No obstetric history on file.     Family History  Problem Relation Age of Onset  .  Hypertension Mother   . Thyroid disease Father   . Diabetes Maternal Grandmother   . Diabetes Maternal Grandfather   . Migraines Neg Hx     Social History   Tobacco Use  . Smoking status: Never Smoker  . Smokeless tobacco: Never Used  Vaping Use  . Vaping Use: Never used  Substance Use Topics  . Alcohol use: Yes    Comment: occasional  . Drug use: Never    Home Medications Prior to Admission medications   Medication Sig Start Date End Date Taking? Authorizing Provider  acyclovir (ZOVIRAX) 400 MG tablet Take 400 mg by mouth 2 (two) times daily. 11/13/19   [provider]  albuterol (PROAIR HFA) 108 (90 Base) MCG/ACT inhaler INHALE 2 PUFFS 3 TIMES A DAY 05/19/16   [provider]  cyclobenzaprine (FLEXERIL) 10 MG tablet Take 1 tablet (10 mg total) by mouth 2 (two) times daily as needed for muscle spasms. 09/30/19   Mesner, Corene Cornea, MD  Erenumab-aooe 140 MG/ML SOAJ Inject 140 mg into the skin every 30 (thirty) days. 11/05/19   Lomax, Amy, NP  famotidine (PEPCID) 40 MG tablet Take 1 tablet (40 mg total) by mouth as needed for heartburn or indigestion. 09/30/19   Mesner, Corene Cornea, MD  fluconazole (DIFLUCAN) 150 MG tablet Take by mouth. 11/26/19   [provider]  fluorouracil (EFUDEX) 5 % cream Apply topically 2 (two) times daily. 11/19/19   Ila Mcgill  S, DPM  fluticasone (FLONASE) 50 MCG/ACT nasal spray 2 sprays by Each Nare route daily. 11/11/19   [provider]  Fremanezumab-vfrm (AJOVY) 225 MG/1.5ML SOAJ Inject 225 mg into the skin every 30 (thirty) days. 11/02/19   Lomax, Amy, NP  ketorolac (TORADOL) 10 MG tablet Take 1 tablet (10 mg total) by mouth every 6 (six) hours as needed. 09/30/19   Mesner, Corene Cornea, MD  losartan (COZAAR) 100 MG tablet Take by mouth. 11/02/19   [provider]  meloxicam (MOBIC) 7.5 MG tablet Take 1 tablet (7.5 mg total) by mouth daily. 09/30/19   Mesner, Corene Cornea, MD  metroNIDAZOLE (FLAGYL) 500 MG tablet SMARTSIG:1 Tablet(s) By Mouth  Morning-Night 11/02/19   [provider]  mometasone-formoterol (DULERA) 200-5 MCG/ACT AERO Inhale into the lungs. 11/02/19   [provider]  naproxen (NAPROSYN) 500 MG tablet Take by mouth. 06/16/19   [provider]  propranolol (INDERAL) 10 MG tablet Take 1 tablet (10 mg total) by mouth 2 (two) times daily. 10/26/19   Lomax, Amy, NP  rizatriptan (MAXALT-MLT) 10 MG disintegrating tablet Take 1 tablet (10 mg total) by mouth as needed for migraine. May repeat in 2 hours if needed 03/18/19   Melvenia Beam, MD  tiZANidine (ZANAFLEX) 4 MG tablet Take 4 mg by mouth every 6 (six) hours as needed. 11/27/19   [provider]  valACYclovir (VALTREX) 500 MG tablet Take 500 mg by mouth daily. 11/12/19   [provider]  Vitamin D, Ergocalciferol, (DRISDOL) 1.25 MG (50000 UNIT) CAPS capsule Take 50,000 Units by mouth once a week. 05/28/19   [provider]  FLUoxetine (PROZAC) 10 MG capsule Take by mouth. 07/30/19 09/30/19  [provider]    Allergies    Nortriptyline, Amoxicillin, Cefaclor, and Cefuroxime axetil  Review of Systems   Review of Systems  Genitourinary: Negative for difficulty urinating and urgency.  Musculoskeletal: Positive for back pain and myalgias.  Skin: Negative for wound.  Neurological: Positive for numbness (Paresthesias). Negative for weakness.   Physical Exam Updated Vital Signs BP (!) 151/97 (BP Location: Right Arm)   Pulse 78   Temp 98.5 F (36.9 C) (Oral)   Resp 16   SpO2 100%   Physical Exam Vitals and nursing note reviewed.  Constitutional:      General: She is not in acute distress.    Appearance: She is well-developed.  HENT:     Head: Normocephalic and atraumatic.     Right Ear: External ear normal.     Left Ear: External ear normal.  Eyes:     General: No scleral icterus.       Right eye: No discharge.        Left eye: No discharge.     Conjunctiva/sclera: Conjunctivae normal.  Neck:      Trachea: No tracheal deviation.  Cardiovascular:     Rate and Rhythm: Normal rate.  Pulmonary:     Effort: Pulmonary effort is normal. No respiratory distress.     Breath sounds: No stridor.  Abdominal:     General: Abdomen is flat. There is no distension.  Musculoskeletal:        General: Tenderness present. No swelling or deformity.     Cervical back: Neck supple.     Comments: Mild TTP noted diffusely over the midline lumbar spine as well as the left lumbar musculature.  Skin:    General: Skin is warm and dry.     Findings: No rash.  Neurological:  General: No focal deficit present.     Mental Status: She is alert and oriented to person, place, and time.     Cranial Nerves: Cranial nerve deficit: no gross deficits.     Comments: Distal sensation intact in the bilateral lower extremities.  Strength is 5 out of 5 in the bilateral lower extremities.  Patient able to ambulate with a steady gait.    ED Results / Procedures / Treatments   Labs (all labs ordered are listed, but only abnormal results are displayed) Labs Reviewed - No data to display  EKG None  Radiology No results found.  Procedures Procedures (including critical care time)  Medications Ordered in ED Medications  dexamethasone (DECADRON) injection 10 mg (has no administration in time range)   ED Course  I have reviewed the triage vital signs and the nursing notes.  Pertinent labs & imaging results that were available during my care of the patient were reviewed by me and considered in my medical decision making (see chart for details).    MDM Rules/Calculators/A&P                         Patient has a lengthy history of sciatica and presents today with acute on chronic sciatic symptoms.  Physical exam is significant for some mild diffuse midline lumbar pain as well as pain across the left lumbar musculature.  She is neurovascularly intact in the bilateral lower extremities.  Denies any saddle anesthesia  or bowel or bladder incontinence.  Exam nonconcerning for cauda equina at this time.  Patient requested IM steroids at this visit.  She has taken prednisone in the past with little relief.  Will give a dose of IM Decadron in ED.  Will discharge with additional Toradol, which she requested.  She has Zanaflex as well as Flexeril at home.  She has not been evaluated by orthopedics, so I will give her a referral to orthopedics as well.  Her questions were answered and she was amicable at the time of discharge.   Final Clinical Impression(s) / ED Diagnoses Final diagnoses:  Sciatica of left side   Rx / DC Orders ED Discharge Orders         Ordered    ketorolac (TORADOL) 10 MG tablet  Every 8 hours PRN        01/31/20 1952           Rayna Sexton, Hershal Coria 01/31/20 2007    Quintella Reichert, MD 01/31/20 2024

## 2020-01-31 NOTE — ED Triage Notes (Signed)
Patient here from home reporting lower back pain. Hx of sciatica.

## 2020-01-31 NOTE — Discharge Instructions (Addendum)
I have sent additional Toradol to your pharmacy.  Please take this as prescribed.  Take it with a small amount of food to help prevent upsetting your stomach.  Below is the contact information for an orthopedist in the area.  I would recommend giving them a call and scheduling a appointment for reevaluation.

## 2020-03-15 DIAGNOSIS — J343 Hypertrophy of nasal turbinates: Secondary | ICD-10-CM | POA: Insufficient documentation

## 2020-05-03 ENCOUNTER — Emergency Department (HOSPITAL_BASED_OUTPATIENT_CLINIC_OR_DEPARTMENT_OTHER)
Admission: EM | Admit: 2020-05-03 | Discharge: 2020-05-03 | Disposition: A | Discharge status: Home / Self Care | Payer: Medicaid Other | Attending: Emergency Medicine | Admitting: Emergency Medicine

## 2020-05-03 ENCOUNTER — Encounter (HOSPITAL_BASED_OUTPATIENT_CLINIC_OR_DEPARTMENT_OTHER): Payer: Self-pay

## 2020-05-03 ENCOUNTER — Other Ambulatory Visit: Payer: Self-pay

## 2020-05-03 DIAGNOSIS — J45909 Unspecified asthma, uncomplicated: Secondary | ICD-10-CM | POA: Diagnosis not present

## 2020-05-03 DIAGNOSIS — Z79899 Other long term (current) drug therapy: Secondary | ICD-10-CM | POA: Insufficient documentation

## 2020-05-03 DIAGNOSIS — M545 Low back pain, unspecified: Secondary | ICD-10-CM | POA: Diagnosis present

## 2020-05-03 DIAGNOSIS — I10 Essential (primary) hypertension: Secondary | ICD-10-CM | POA: Diagnosis not present

## 2020-05-03 DIAGNOSIS — G8929 Other chronic pain: Secondary | ICD-10-CM | POA: Diagnosis not present

## 2020-05-03 MED ORDER — METHYLPREDNISOLONE SODIUM SUCC 40 MG IJ SOLR
40.0000 mg | Freq: Once | INTRAMUSCULAR | Status: AC
  Administered 2020-05-03: 40 mg via INTRAMUSCULAR
  Filled 2020-05-03: qty 1

## 2020-05-03 MED ORDER — LIDOCAINE 5 % EX PTCH: 1.0000 | MEDICATED_PATCH | CUTANEOUS | 0 refills | Status: AC

## 2020-05-03 NOTE — ED Triage Notes (Signed)
Pain in lower back x6-71months, had MRI yesterday, found herniated disc. States bent down in shower today and pain worsened. Took 20mg  toradol 1430. Tingling in left leg.

## 2020-05-03 NOTE — Discharge Instructions (Addendum)
You have been seen here for back pain.  Given you a prescription for lidocaine patch please apply to the affected area.  I recommend taking over-the-counter pain medications like ibuprofen and/or Tylenol every 6 hours as needed.  Please follow dosage and on the back of bottle.  I also recommend applying heat to the area and stretching out the muscles as this will help decrease stiffness and pain.  I have given you information on exercises please follow.  Recommend following up with your with your orthopedic surgeon for further evaluation.  Come back to the emergency department if you develop chest pain, shortness of breath, severe abdominal pain, uncontrolled nausea, vomiting, diarrhea.

## 2020-05-03 NOTE — ED Provider Notes (Signed)
[facility] Provider Note   CSN: [id number] Arrival date & time: 05/03/20  1950     History Chief Complaint  Patient presents with  . Back Pain    Rebecca Blackburn is a 35 y.o. female.  HPI   Patient with significant medical history of chronic back pain  Present with chief complaint of lower back pain.  Patient endorses pain started suddenly today after   bent the wrong way while in the shower.  Patient states she had a knifelike sensation in her back and electrical-like pain that shot down her left leg.  She had intermittent paresthesias in her left leg which has since resolved, she denies urinary retention or incontinence, difficult bowel movements, denies current paresthesia or weakness the upper lower extremities.  Patient states that she has severe herniated disc, she recent had a MRI performed yesterday which shows large left lateral disc herniation at L5 and S1.  She is scheduled to follow-up with her orthopedic surgery for steroid injection.  Patient denies recent trauma to the area, denies IV drug use, denies urinary symptoms.  She denies any alleviating factors at this time.  Patient denies headaches, fevers, chills, shortness of breath, chest pain, abdominal pain, nausea, vomiting, worsening pedal edema.  Past Medical History:  Diagnosis Date  . Asthma   . High blood pressure   . Migraine   . Varicella     Patient Active Problem List   Diagnosis Date Noted  . Migraine with aura and without status migrainosus, not intractable 03/18/2019  . Seborrheic dermatitis 02/13/2019  . Dizziness 01/13/2019  . 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 ASCUS of cervix with negative high risk HPV 11/20/2018  . Encounter for gynecological examination without abnormal finding 11/07/2018  . Genital warts 09/30/2018  . History of loop electrical excision procedure (LEEP) 09/30/2018  . 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 Essential hypertension 01/23/2018  .  GERD with esophagitis 01/23/2018  . Migraine without aura and without status migrainosus, not intractable 01/23/2018  . Morbid obesity due to excess calories ([place]) 01/23/2018    Past Surgical History:  Procedure Laterality Date  . CESAREAN SECTION     2016 and 2006  . SKIN BIOPSY    . TONSILLECTOMY       OB History   No obstetric history on file.     Family History  Problem Relation Age of Onset  . Hypertension Mother   . Thyroid disease Father   . Diabetes Maternal Grandmother   . Diabetes Maternal Grandfather   . Migraines Neg Hx     Social History   Tobacco Use  . Smoking status: Never Smoker  . Smokeless tobacco: Never Used  Vaping Use  . Vaping Use: Never used  Substance Use Topics  . Alcohol use: Yes    Comment: occasional  . Drug use: Never    Home Medications Prior to Admission medications   Medication Sig Start Date End Date Taking? Authorizing Provider  lidocaine (LIDODERM) 5 % Place 1 patch onto the skin daily for 7 days. Remove & Discard patch within 12 hours or as directed by MD 05/03/20 05/10/20 Yes Marcello Fennel, PA-C  acyclovir (ZOVIRAX) 400 MG tablet Take 400 mg by mouth 2 (two) times daily. 11/13/19   [provider]  albuterol (PROAIR HFA) 108 (90 Base) MCG/ACT inhaler INHALE 2 PUFFS 3 TIMES A DAY 05/19/16   [provider]  cyclobenzaprine (FLEXERIL) 10 MG tablet Take 1 tablet (10 mg  total) by mouth 2 (two) times daily as needed for muscle spasms. 09/30/19   Mesner, Corene Cornea, MD  Erenumab-aooe 140 MG/ML SOAJ Inject 140 mg into the skin every 30 (thirty) days. 11/05/19   Lomax, Amy, NP  famotidine (PEPCID) 40 MG tablet Take 1 tablet (40 mg total) by mouth as needed for heartburn or indigestion. 09/30/19   Mesner, Corene Cornea, MD  fluconazole (DIFLUCAN) 150 MG tablet Take by mouth. 11/26/19   [provider]  fluorouracil (EFUDEX) 5 % cream Apply topically 2 (two) times daily. 11/19/19   Wallene Huh, DPM  fluticasone (FLONASE) 50  MCG/ACT nasal spray 2 sprays by Each Nare route daily. 11/11/19   [provider]  Fremanezumab-vfrm (AJOVY) 225 MG/1.5ML SOAJ Inject 225 mg into the skin every 30 (thirty) days. 11/02/19   Lomax, Amy, NP  ketorolac (TORADOL) 10 MG tablet Take 1 tablet (10 mg total) by mouth every 8 (eight) hours as needed. 01/31/20   Rayna Sexton, PA-C  losartan (COZAAR) 100 MG tablet Take by mouth. 11/02/19   [provider]  meloxicam (MOBIC) 7.5 MG tablet Take 1 tablet (7.5 mg total) by mouth daily. 09/30/19   Mesner, Corene Cornea, MD  metroNIDAZOLE (FLAGYL) 500 MG tablet SMARTSIG:1 Tablet(s) By Mouth Morning-Night 11/02/19   [provider]  mometasone-formoterol (DULERA) 200-5 MCG/ACT AERO Inhale into the lungs. 11/02/19   [provider]  naproxen (NAPROSYN) 500 MG tablet Take by mouth. 06/16/19   [provider]  propranolol (INDERAL) 10 MG tablet Take 1 tablet (10 mg total) by mouth 2 (two) times daily. 10/26/19   Lomax, Amy, NP  rizatriptan (MAXALT-MLT) 10 MG disintegrating tablet Take 1 tablet (10 mg total) by mouth as needed for migraine. May repeat in 2 hours if needed 03/18/19   Melvenia Beam, MD  tiZANidine (ZANAFLEX) 4 MG tablet Take 4 mg by mouth every 6 (six) hours as needed. 11/27/19   [provider]  valACYclovir (VALTREX) 500 MG tablet Take 500 mg by mouth daily. 11/12/19   [provider]  Vitamin D, Ergocalciferol, (DRISDOL) 1.25 MG (50000 UNIT) CAPS capsule Take 50,000 Units by mouth once a week. 05/28/19   [provider]  FLUoxetine (PROZAC) 10 MG capsule Take by mouth. 07/30/19 09/30/19  [provider]    Allergies    Nortriptyline, Amoxicillin, Cefaclor, and Cefuroxime axetil  Review of Systems   Review of Systems  Constitutional: Negative for chills and fever.  HENT: Negative for congestion and sore throat.   Respiratory: Negative for shortness of breath.   Cardiovascular: Negative for chest pain.  Gastrointestinal:  Negative for abdominal pain, diarrhea, nausea and vomiting.  Genitourinary: Negative for difficulty urinating, enuresis and urgency.  Musculoskeletal: Positive for back pain.  Skin: Negative for rash.  Neurological: Negative for dizziness and headaches.  Hematological: Does not bruise/bleed easily.    Physical Exam Updated Vital Signs BP 125/77 (BP Location: Left Arm)   Pulse 75   Temp 98.1 F (36.7 C) (Oral)   Resp 20   Ht 5\' 1"  (1.549 m)   Wt 117.9 kg   LMP 04/20/2020   SpO2 96%   BMI 49.13 kg/m   Physical Exam Vitals and nursing note reviewed.  Constitutional:      General: She is not in acute distress.    Appearance: She is not ill-appearing.  HENT:     Head: Normocephalic and atraumatic.     Nose: No congestion.  Eyes:     Conjunctiva/sclera: Conjunctivae normal.  Cardiovascular:  Rate and Rhythm: Normal rate and regular rhythm.  Pulmonary:     Effort: Pulmonary effort is normal.  Abdominal:     Palpations: Abdomen is soft.     Tenderness: There is no abdominal tenderness. There is no right CVA tenderness or left CVA tenderness.  Musculoskeletal:     Right lower leg: No edema.     Left lower leg: No edema.     Comments: Patient spine was palpated, she was tender to palpation along her lumbar region, there is no step-off or deformities present.  Patient is moving all 4 extremities without difficulty.  Skin:    General: Skin is warm and dry.  Neurological:     Mental Status: She is alert.  Psychiatric:        Mood and Affect: Mood normal.     ED Results / Procedures / Treatments   Labs (all labs ordered are listed, but only abnormal results are displayed) Labs Reviewed - No data to display  EKG None  Radiology No results found.  Procedures Procedures   Medications Ordered in ED Medications  methylPREDNISolone sodium succinate (SOLU-MEDROL) 40 mg/mL injection 40 mg (40 mg Intramuscular Given 05/03/20 2310)    ED Course  I have reviewed the  triage vital signs and the nursing notes.  Pertinent labs & imaging results that were available during my care of the patient were reviewed by me and considered in my medical decision making (see chart for details).    MDM Rules/Calculators/A&P                          Initial impression-patient presents with lower back pain.  She is alert, does not appear acute distress, vital signs reassuring.  Will provide patient with a shot of steroids  Work-up-due to well-appearing patient, benign physical exam further lab or imaging not warranted at this time.  Rule out- I have low suspicion for spinal fracture or spinal cord abnormality as patient denies urinary incontinency, retention, difficulty with bowel movements, denies saddle paresthesias.  Spine was palpated there is no step-off, crepitus or gross deformities felt, patient had 5/5 strength, full range of motion, neurovascular fully intact in the lower extremities.  Will defer imaging at this time as she had no recent trauma to the area .  Plan-suspect patient suffering from a flareup of her herniated disc, provide patient with a steroid shot, will provide patient with lidocaine patches and have her follow-up with orthopedic surgery for further evaluation.  Vital signs have remained stable, no indication for hospital admission. Patient given at home care as well strict return precautions.  Patient verbalized that they understood agreed to said plan.   Final Clinical Impression(s) / ED Diagnoses Final diagnoses:  Chronic midline low back pain without sciatica    Rx / DC Orders ED Discharge Orders         Ordered    lidocaine (LIDODERM) 5 %  Every 24 hours        05/03/20 2301           Marcello Fennel, PA-C 05/03/20 2322    Drenda Freeze, MD 05/04/20 [id number]

## 2020-05-26 ENCOUNTER — Encounter: Payer: Self-pay | Admitting: Podiatry

## 2020-05-26 ENCOUNTER — Other Ambulatory Visit: Payer: Self-pay

## 2020-05-26 ENCOUNTER — Ambulatory Visit (INDEPENDENT_AMBULATORY_CARE_PROVIDER_SITE_OTHER): Payer: Medicaid Other | Admitting: Podiatry

## 2020-05-26 DIAGNOSIS — D492 Neoplasm of unspecified behavior of bone, soft tissue, and skin: Secondary | ICD-10-CM | POA: Diagnosis not present

## 2020-05-26 DIAGNOSIS — D367 Benign neoplasm of other specified sites: Secondary | ICD-10-CM | POA: Diagnosis not present

## 2020-05-29 NOTE — Progress Notes (Signed)
Subjective:   Patient ID: Rebecca Blackburn, female   DOB: 35 y.o.   MRN: [id number]   HPI Patient presents stating she has lesions on the bottom of the right foot and it did well for about 6 months and has reoccurred   ROS      Objective:  Physical Exam  Neurovascular status intact with discomfort plantar aspect right that upon debridement shows pinpoint bleeding     Assessment:  Verruca plantaris plantar right     Plan:  Aggressive debridement accomplished applied chemical agent to create immune response with sterile dressing instructed what to do if any ulceration or an blister should occur

## 2020-06-14 DIAGNOSIS — U099 Post covid-19 condition, unspecified: Secondary | ICD-10-CM | POA: Insufficient documentation

## 2020-06-24 HISTORY — PX: LUMBAR MICRODISCECTOMY: SHX99

## 2020-07-21 ENCOUNTER — Encounter: Payer: Self-pay | Admitting: Neurology

## 2020-07-21 ENCOUNTER — Ambulatory Visit: Payer: Medicaid Other | Admitting: Neurology

## 2020-07-21 VITALS — BP 118/79 | HR 92 | Ht 61.0 in | Wt 259.0 lb

## 2020-07-21 DIAGNOSIS — G8929 Other chronic pain: Secondary | ICD-10-CM

## 2020-07-21 DIAGNOSIS — R51 Headache with orthostatic component, not elsewhere classified: Secondary | ICD-10-CM

## 2020-07-21 DIAGNOSIS — G43109 Migraine with aura, not intractable, without status migrainosus: Secondary | ICD-10-CM

## 2020-07-21 DIAGNOSIS — R2990 Unspecified symptoms and signs involving the nervous system: Secondary | ICD-10-CM

## 2020-07-21 DIAGNOSIS — H5460 Unqualified visual loss, one eye, unspecified: Secondary | ICD-10-CM | POA: Diagnosis not present

## 2020-07-21 DIAGNOSIS — R202 Paresthesia of skin: Secondary | ICD-10-CM

## 2020-07-21 DIAGNOSIS — R519 Headache, unspecified: Secondary | ICD-10-CM | POA: Diagnosis not present

## 2020-07-21 DIAGNOSIS — M792 Neuralgia and neuritis, unspecified: Secondary | ICD-10-CM

## 2020-07-21 DIAGNOSIS — M542 Cervicalgia: Secondary | ICD-10-CM

## 2020-07-21 DIAGNOSIS — G43009 Migraine without aura, not intractable, without status migrainosus: Secondary | ICD-10-CM | POA: Diagnosis not present

## 2020-07-21 DIAGNOSIS — R200 Anesthesia of skin: Secondary | ICD-10-CM

## 2020-07-21 DIAGNOSIS — N926 Irregular menstruation, unspecified: Secondary | ICD-10-CM

## 2020-07-21 MED ORDER — METHYLPREDNISOLONE 4 MG PO TBPK: ORAL_TABLET | ORAL | 1 refills | Status: DC
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
## 2020-07-21 NOTE — Patient Instructions (Addendum)
If your pregnancy test is positive we will start botox If your pregnancy test is negative: Start Qulipta. Consider Emgality  Atogepant: Patient drug information   Access Lex[employer]omp Online for additional drug information, tools, and databases. Copyright (407) 549-3439 [employer]. All rights reserved. Contributor Disclosures (For additional information see "Atogepant: Drug information")  You must carefully read the "Consumer Information Use and Disclaimer" below in order to understand and correctly use this information.  Brand Names: [place]  Lenoria Chime  What is this drug used for?   It is used to prevent migraine headaches.  What do I need to tell my doctor BEFORE I take this drug?   If you are allergic to this drug; any part of this drug; or any other drugs, foods, or substances. Tell your doctor about the allergy and what signs you had.   If you have liver disease.   This is not a list of all drugs or health problems that interact with this drug.   Tell your doctor and pharmacist about all of your drugs (prescription or OTC, natural products, vitamins) and health problems. You must check to make sure that it is safe for you to take this drug with all of your drugs and health problems. Do not start, stop, or change the dose of any drug without checking with your doctor.  What are some things I need to know or do while I take this drug?   Tell all of your health care providers that you take this drug. This includes your doctors, nurses, pharmacists, and dentists.   If you drink grapefruit juice or eat grapefruit often, talk with your doctor.   Tell your doctor if you are pregnant, plan on getting pregnant, or are breast-feeding. You will need to talk about the benefits and risks to you and the baby.  What are some side effects that I need to call my doctor about right away?   WARNING/CAUTION: Even though it may be rare, some people may have very bad and sometimes deadly side effects when taking a  drug. Tell your doctor or get medical help right away if you have any of the following signs or symptoms that may be related to a very bad side effect:   Signs of an allergic reaction, like rash; hives; itching; red, swollen, blistered, or peeling skin with or without fever; wheezing; tightness in the chest or throat; trouble breathing, swallowing, or talking; unusual hoarseness; or swelling of the mouth, face, lips, tongue, or throat.  What are some other side effects of this drug?   All drugs may cause side effects. However, many people have no side effects or only have minor side effects. Call your doctor or get medical help if any of these side effects or any other side effects bother you or do not go away:   Upset stomach.   Constipation.   Feeling sleepy.   Feeling tired or weak.   These are not all of the side effects that may occur. If you have questions about side effects, call your doctor. Call your doctor for medical advice about side effects.   You may report side effects to your national health agency.  How is this drug best taken?   Use this drug as ordered by your doctor. Read all information given to you. Follow all instructions closely.   Take with or without food.   If you are on dialysis and are taking this drug on the day you get dialysis, take it after your  dialysis. If you have questions, talk with your doctor.  What do I do if I miss a dose?   Take a missed dose as soon as you think about it.   If it is close to the time for your next dose, skip the missed dose and go back to your normal time.   Do not take 2 doses at the same time or extra doses.  How do I store and/or throw out this drug?   Store at room temperature in a dry place. Do not store in a bathroom.   Keep all drugs in a safe place. Keep all drugs out of the reach of children and pets.   Throw away unused or expired drugs. Do not flush down a toilet or pour down a drain unless you are told to do so. Check  with your pharmacist if you have questions about the best way to throw out drugs. There may be drug take-back programs in your area.  General drug facts   If your symptoms or health problems do not get better or if they become worse, call your doctor.   Do not share your drugs with others and do not take anyone else's drugs.   Some drugs may have another patient information leaflet. If you have any questions about this drug, please talk with your doctor, nurse, pharmacist, or other health care provider.   If you think there has been an overdose, call your poison control center or get medical care right away. Be ready to tell or show what was taken, how much, and when it happened.  Last Reviewed Date2021-10-06 Consumer Information Use and Disclaimer   This generalized information is a limited summary of diagnosis, treatment, and/or medication information. It is not meant to be comprehensive and should be used as a tool to help the user understand and/or assess potential diagnostic and treatment options. It does NOT include all information about conditions, treatments, medications, side effects, or risks that may apply to a specific patient. It is not intended to be medical advice or a substitute for the medical advice, diagnosis, or treatment of a health care provider based on the health care provider's examination and assessment of a patient's specific and unique circumstances. Patients must speak with a health care provider for complete information about their health, medical questions, and treatment options, including any risks or benefits regarding use of medications. This information does not endorse any treatments or medications as safe, effective, or approved for treating a specific patient. UpToDate, Inc. and its affiliates disclaim any warranty or liability relating to this information or the use thereof. The use of this information is governed by the Terms of Use, available at  https://www.wolterskluwer.com/en/know/clinical-effectiveness-terms.     2022 UpToDate, Inc. and its affiliates and/or [place]. All rights reserved     Galcanezumab injection What is this medicine? GALCANEZUMAB (gal ka NEZ ue mab) is used to prevent migraines and treat cluster headaches. This medicine may be used for other purposes; ask your health care provider or pharmacist if you have questions. COMMON BRAND NAME(S): Emgality What should I tell my health care provider before I take this medicine? They need to know if you have any of these conditions:  an unusual or allergic reaction to galcanezumab, other medicines, foods, dyes, or preservatives  pregnant or trying to get pregnant  breast-feeding How should I use this medicine? This medicine is for injection under the skin. You will be taught how to prepare and give this medicine.  Use exactly as directed. Take your medicine at regular intervals. Do not take your medicine more often than directed. It is important that you put your used needles and syringes in a special sharps container. Do not put them in a trash can. If you do not have a sharps container, call your pharmacist or healthcare provider to get one. Talk to your pediatrician regarding the use of this medicine in children. Special care may be needed. Overdosage: If you think you have taken too much of this medicine contact a poison control center or emergency room at once. NOTE: This medicine is only for you. Do not share this medicine with others. What if I miss a dose? If you miss a dose, take it as soon as you can. If it is almost time for your next dose, take only that dose. Do not take double or extra doses. What may interact with this medicine? Interactions are not expected. This list may not describe all possible interactions. Give your health care provider a list of all the medicines, herbs, non-prescription drugs, or dietary supplements you use. Also tell them if  you smoke, drink alcohol, or use illegal drugs. Some items may interact with your medicine. What should I watch for while using this medicine? Tell your doctor or healthcare professional if your symptoms do not start to get better or if they get worse. What side effects may I notice from receiving this medicine? Side effects that you should report to your doctor or health care professional as soon as possible:  allergic reactions like skin rash, itching or hives, swelling of the face, lips, or tongue Side effects that usually do not require medical attention (report these to your doctor or health care professional if they continue or are bothersome):  pain, redness, or irritation at site where injected This list may not describe all possible side effects. Call your doctor for medical advice about side effects. You may report side effects to FDA at 1-800-FDA-1088. Where should I keep my medicine? Keep out of the reach of children. You will be instructed on how to store this medicine. Throw away any unused medicine after the expiration date on the label. NOTE: This sheet is a summary. It may not cover all possible information. If you have questions about this medicine, talk to your doctor, pharmacist, or health care provider.  2021 Elsevier/Gold Standard (2017-08-14 12:03:23) OnabotulinumtoxinA injection (Medical Use) What is this medicine? ONABOTULINUMTOXINA (o na BOTT you lye num tox in eh) is a neuro-muscular blocker. This medicine is used to treat crossed eyes, eyelid spasms, severe neck muscle spasms, ankle and toe muscle spasms, and elbow, wrist, and finger muscle spasms. It is also used to treat excessive underarm sweating, to prevent chronic migraine headaches, and to treat loss of bladder control due to neurologic conditions such as multiple sclerosis or spinal cord injury. This medicine may be used for other purposes; ask your health care provider or pharmacist if you have  questions. COMMON BRAND NAME(S): Botox What should I tell my health care provider before I take this medicine? They need to know if you have any of these conditions:  breathing problems  cerebral palsy spasms  difficulty urinating  heart problems  history of surgery where this medicine is going to be used  infection at the site where this medicine is going to be used  myasthenia gravis or other neurologic disease  nerve or muscle disease  surgery plans  take medicines that treat or prevent blood  clots  thyroid problems  an unusual or allergic reaction to botulinum toxin, albumin, other medicines, foods, dyes, or preservatives  pregnant or trying to get pregnant  breast-feeding How should I use this medicine? This medicine is for injection into a muscle. It is given by a health care professional in a hospital or clinic setting. Talk to your pediatrician regarding the use of this medicine in children. While this drug may be prescribed for children as young as 64 years old for selected conditions, precautions do apply. Overdosage: If you think you have taken too much of this medicine contact a poison control center or emergency room at once. NOTE: This medicine is only for you. Do not share this medicine with others. What if I miss a dose? This does not apply. What may interact with this medicine?  aminoglycoside antibiotics like gentamicin, neomycin, tobramycin  muscle relaxants  other botulinum toxin injections This list may not describe all possible interactions. Give your health care provider a list of all the medicines, herbs, non-prescription drugs, or dietary supplements you use. Also tell them if you smoke, drink alcohol, or use illegal drugs. Some items may interact with your medicine. What should I watch for while using this medicine? Visit your doctor for regular check ups. This medicine will cause weakness in the muscle where it is injected. Tell your doctor  if you feel unusually weak in other muscles. Get medical help right away if you have problems with breathing, swallowing, or talking. This medicine might make your eyelids droop or make you see blurry or double. If you have weak muscles or trouble seeing do not drive a car, use machinery, or do other dangerous activities. This medicine contains albumin from human blood. It may be possible to pass an infection in this medicine, but no cases have been reported. Talk to your doctor about the risks and benefits of this medicine. If your activities have been limited by your condition, go back to your regular routine slowly after treatment with this medicine. What side effects may I notice from receiving this medicine? Side effects that you should report to your doctor or health care professional as soon as possible:  allergic reactions like skin rash, itching or hives, swelling of the face, lips, or tongue  breathing problems  changes in vision  chest pain or tightness  eye irritation, pain  fast, irregular heartbeat  infection  numbness  speech problems  swallowing problems  unusual weakness Side effects that usually do not require medical attention (report to your doctor or health care professional if they continue or are bothersome):  bruising or pain at site where injected  drooping eyelid  dry eyes or mouth  headache  muscles aches, pains  sensitivity to light  tearing This list may not describe all possible side effects. Call your doctor for medical advice about side effects. You may report side effects to FDA at 1-800-FDA-1088. Where should I keep my medicine? This drug is given in a hospital or clinic and will not be stored at home. NOTE: This sheet is a summary. It may not cover all possible information. If you have questions about this medicine, talk to your doctor, pharmacist, or health care provider.  2021 Elsevier/Gold Standard (2017-09-02  14:21:42)   Methylprednisolone tablets What is this medicine? METHYLPREDNISOLONE (meth ill pred NISS oh lone) is a corticosteroid. It is commonly used to treat inflammation of the skin, joints, lungs, and other organs. Common conditions treated include asthma, allergies, and arthritis.  It is also used for other conditions, such as blood disorders and diseases of the adrenal glands. This medicine may be used for other purposes; ask your health care provider or pharmacist if you have questions. COMMON BRAND NAME(S): Medrol, Medrol Dosepak What should I tell my health care provider before I take this medicine? They need to know if you have any of these conditions:  Cushing's syndrome  eye disease, vision problems  diabetes  glaucoma  heart disease  high blood pressure  infection (especially a virus infection such as chickenpox, cold sores, or herpes)  liver disease  mental illness  myasthenia gravis  osteoporosis  recently received or scheduled to receive a vaccine  seizures  stomach or intestine problems  thyroid disease  an unusual or allergic reaction to lactose, methylprednisolone, other medicines, foods, dyes, or preservatives  pregnant or trying to get pregnant  breast-feeding How should I use this medicine? Take this medicine by mouth with a glass of water. Follow the directions on the prescription label. Take this medicine with food. If you are taking this medicine once a day, take it in the morning. Do not take it more often than directed. Do not suddenly stop taking your medicine because you may develop a severe reaction. Your doctor will tell you how much medicine to take. If your doctor wants you to stop the medicine, the dose may be slowly lowered over time to avoid any side effects. Talk to your pediatrician regarding the use of this medicine in children. Special care may be needed. Overdosage: If you think you have taken too much of this medicine contact a  poison control center or emergency room at once. NOTE: This medicine is only for you. Do not share this medicine with others. What if I miss a dose? If you miss a dose, take it as soon as you can. If it is almost time for your next dose, talk to your doctor or health care professional. You may need to miss a dose or take an extra dose. Do not take double or extra doses without advice. What may interact with this medicine? Do not take this medicine with any of the following medications:  alefacept  echinacea  live virus vaccines  metyrapone  mifepristone This medicine may also interact with the following medications:  amphotericin B  aspirin and aspirin-like medicines  certain antibiotics like erythromycin, clarithromycin, troleandomycin  certain medicines for diabetes  certain medicines for fungal infections like ketoconazole  certain medicines for seizures like carbamazepine, phenobarbital, phenytoin  certain medicines that treat or prevent blood clots like warfarin  cholestyramine  cyclosporine  digoxin  diuretics  female hormones, like estrogens and birth control pills  isoniazid  NSAIDs, medicines for pain inflammation, like ibuprofen or naproxen  other medicines for myasthenia gravis  rifampin  vaccines This list may not describe all possible interactions. Give your health care provider a list of all the medicines, herbs, non-prescription drugs, or dietary supplements you use. Also tell them if you smoke, drink alcohol, or use illegal drugs. Some items may interact with your medicine. What should I watch for while using this medicine? Tell your doctor or healthcare professional if your symptoms do not start to get better or if they get worse. Do not stop taking except on your doctor's advice. You may develop a severe reaction. Your doctor will tell you how much medicine to take. This medicine may increase your risk of getting an infection. Tell your doctor  or health care  professional if you are around anyone with measles or chickenpox, or if you develop sores or blisters that do not heal properly. This medicine may increase blood sugar levels. Ask your healthcare provider if changes in diet or medicines are needed if you have diabetes. Tell your doctor or health care professional right away if you have any change in your eyesight. Using this medicine for a long time may increase your risk of low bone mass. Talk to your doctor about bone health. What side effects may I notice from receiving this medicine? Side effects that you should report to your doctor or health care professional as soon as possible:  allergic reactions like skin rash, itching or hives, swelling of the face, lips, or tongue  bloody or tarry stools  hallucination, loss of contact with reality  muscle cramps  muscle pain  palpitations  signs and symptoms of high blood sugar such as being more thirsty or hungry or having to urinate more than normal. You may also feel very tired or have blurry vision.  signs and symptoms of infection like fever or chills; cough; sore throat; pain or trouble passing urine Side effects that usually do not require medical attention (report to your doctor or health care professional if they continue or are bothersome):  changes in emotions or mood  constipation  diarrhea  excessive hair growth on the face or body  headache  nausea, vomiting  trouble sleeping  weight gain This list may not describe all possible side effects. Call your doctor for medical advice about side effects. You may report side effects to FDA at 1-800-FDA-1088. Where should I keep my medicine? Keep out of the reach of children. Store at room temperature between 20 and 25 degrees C (68 and 77 degrees F). Throw away any unused medicine after the expiration date. NOTE: This sheet is a summary. It may not cover all possible information. If you have questions about  this medicine, talk to your doctor, pharmacist, or health care provider.  2021 Elsevier/Gold Standard (2017-11-28 09:19:36)

## 2020-07-22 ENCOUNTER — Telehealth: Payer: Self-pay | Admitting: Neurology

## 2020-07-22 LAB — PREGNANCY, URINE: Preg Test, Ur: NEGATIVE

## 2020-07-22 NOTE — Telephone Encounter (Signed)
Pt scheduled for nurse visit Monday 5/16.

## 2020-07-22 NOTE — Telephone Encounter (Signed)
Spoke to patient, she is negative, will prescribe emgality. Please call and schedule a nurse appointment and set a box of emgality aside for her. thanks

## 2020-07-25 ENCOUNTER — Ambulatory Visit: Payer: Self-pay

## 2020-07-25 ENCOUNTER — Other Ambulatory Visit: Payer: Self-pay | Admitting: Neurology

## 2020-07-25 ENCOUNTER — Telehealth: Payer: Self-pay | Admitting: *Deleted

## 2020-07-25 DIAGNOSIS — Z9889 Other specified postprocedural states: Secondary | ICD-10-CM | POA: Insufficient documentation

## 2020-07-25 MED ORDER — QULIPTA 60 MG PO TABS: 60.0000 mg | ORAL_TABLET | Freq: Every day | ORAL | 0 refills | Status: DC

## 2020-07-25 NOTE — Telephone Encounter (Signed)
-----   Message from Melvenia Beam, MD sent at 07/25/2020 11:07 AM EDT ----- Please fill out for qulipta 60  Failed nortriptline(side effects), topiramate(side effects), amitriptyline (side effects), propranolol contraindicated due to asthma, verapamil with hypotension

## 2020-07-25 NOTE — Telephone Encounter (Signed)
Faxed Qulipta order form (signed by Dr  Jaynee Eagles) along with copy of insurance card to [place]. Received a receipt of confirmation.

## 2020-07-25 NOTE — Telephone Encounter (Signed)
Qulipta 60 mg prescription enrollment form completed and is pending MD signature. Also printed insurance card to include for benefits review.

## 2020-07-27 ENCOUNTER — Telehealth: Payer: Self-pay | Admitting: Neurology

## 2020-07-27 NOTE — Telephone Encounter (Signed)
Contacted patient's insurance [facility]) to obtain auth for MRIs and was informed Josem Kaufmann is through [place] [phone]). Called [facility] and spoke with Jackson Latino to initiate auth. MR Brain w/wo contrast and MR Cervical spine w/wo contrast are pending. Tracking #[id number].

## 2020-07-28 NOTE — Telephone Encounter (Signed)
Received request for additional notes in order to approve MRI Brain & MRI Cervical Spine. Faxed notes to [place] @ [phone].

## 2020-08-01 NOTE — Telephone Encounter (Signed)
We just received this PA form via fax. Will complete as soon as possible.

## 2020-08-01 NOTE — Telephone Encounter (Signed)
Qulipta (Benita) called, showing PA for  Atogepant (QULIPTA) 60 MG TABS has not been initiated. Would like a call from the nurse.   Contact info: [phone]

## 2020-08-01 NOTE — Telephone Encounter (Signed)
[facility] Medicaid sent a fax stating they will not approve MRI Cervical Spine unless there are notes stating patient has done at least 6 weeks of physical therapy in the last 6 months. If they do not receive this information or MD does not do a peer-to-peer, the MRI will not be approved.

## 2020-08-01 NOTE — Telephone Encounter (Signed)
[place] let patient know, I am willing to refer to PT if she wants to go forward with that thanks

## 2020-08-01 NOTE — Telephone Encounter (Signed)
Lenoria Chime PA completed, signed, and faxed to [facility]. Received a receipt of confirmation.

## 2020-08-02 NOTE — Telephone Encounter (Signed)
Faxed updated notes to [facility].

## 2020-08-04 NOTE — Telephone Encounter (Signed)
[employer] approved both MRIs. Sent to [place], they will call pt to schedule.  Cervical Spine: [id number] (07/27/20- 09/25/20) Brain: [id number] (07/27/20- 09/25/20)

## 2020-08-07 ENCOUNTER — Encounter (HOSPITAL_BASED_OUTPATIENT_CLINIC_OR_DEPARTMENT_OTHER): Payer: Self-pay | Admitting: *Deleted

## 2020-08-07 ENCOUNTER — Emergency Department (HOSPITAL_BASED_OUTPATIENT_CLINIC_OR_DEPARTMENT_OTHER): Payer: Medicaid Other

## 2020-08-07 ENCOUNTER — Emergency Department (HOSPITAL_BASED_OUTPATIENT_CLINIC_OR_DEPARTMENT_OTHER)
Admission: EM | Admit: 2020-08-07 | Discharge: 2020-08-07 | Disposition: A | Discharge status: Home / Self Care | Payer: Medicaid Other | Attending: Emergency Medicine | Admitting: Emergency Medicine

## 2020-08-07 ENCOUNTER — Other Ambulatory Visit: Payer: Self-pay

## 2020-08-07 DIAGNOSIS — U071 COVID-19: Secondary | ICD-10-CM | POA: Insufficient documentation

## 2020-08-07 DIAGNOSIS — I10 Essential (primary) hypertension: Secondary | ICD-10-CM | POA: Insufficient documentation

## 2020-08-07 DIAGNOSIS — Z79899 Other long term (current) drug therapy: Secondary | ICD-10-CM | POA: Diagnosis not present

## 2020-08-07 DIAGNOSIS — Z20822 Contact with and (suspected) exposure to covid-19: Secondary | ICD-10-CM

## 2020-08-07 DIAGNOSIS — Z7951 Long term (current) use of inhaled steroids: Secondary | ICD-10-CM | POA: Insufficient documentation

## 2020-08-07 DIAGNOSIS — J4521 Mild intermittent asthma with (acute) exacerbation: Secondary | ICD-10-CM | POA: Insufficient documentation

## 2020-08-07 DIAGNOSIS — R059 Cough, unspecified: Secondary | ICD-10-CM

## 2020-08-07 DIAGNOSIS — R0602 Shortness of breath: Secondary | ICD-10-CM | POA: Diagnosis present

## 2020-08-07 LAB — COMPREHENSIVE METABOLIC PANEL
ALT: 15 U/L (ref 0–44)
AST: 18 U/L (ref 15–41)
Albumin: 3.5 g/dL (ref 3.5–5.0)
Alkaline Phosphatase: 62 U/L (ref 38–126)
Anion gap: 6 (ref 5–15)
BUN: 8 mg/dL (ref 6–20)
CO2: 24 mmol/L (ref 22–32)
Calcium: 8.2 mg/dL — ABNORMAL LOW (ref 8.9–10.3)
Chloride: 107 mmol/L (ref 98–111)
Creatinine, Ser: 0.69 mg/dL (ref 0.44–1.00)
GFR, Estimated: >60 mL/min (ref >60)
Glucose, Bld: 101 mg/dL — ABNORMAL HIGH (ref 70–99)
Potassium: 3.7 mmol/L (ref 3.5–5.1)
Sodium: 137 mmol/L (ref 135–145)
Total Bilirubin: 0.5 mg/dL (ref 0.3–1.2)
Total Protein: 6.8 g/dL (ref 6.5–8.1)

## 2020-08-07 LAB — CBC WITH DIFFERENTIAL/PLATELET
Abs Immature Granulocytes: 0.02 10*3/uL (ref 0.00–0.07)
Basophils Absolute: 0 10*3/uL (ref 0.0–0.1)
Basophils Relative: 0 %
Eosinophils Absolute: 0.2 10*3/uL (ref 0.0–0.5)
Eosinophils Relative: 2 %
HCT: 37.5 % (ref 36.0–46.0)
Hemoglobin: 12.5 g/dL (ref 12.0–15.0)
Immature Granulocytes: 0 %
Lymphocytes Relative: 19 %
Lymphs Abs: 1.4 10*3/uL (ref 0.7–4.0)
MCH: 28.2 pg (ref 26.0–34.0)
MCHC: 33.3 g/dL (ref 30.0–36.0)
MCV: 84.5 fL (ref 80.0–100.0)
Monocytes Absolute: 0.4 10*3/uL (ref 0.1–1.0)
Monocytes Relative: 6 %
Neutro Abs: 5.2 10*3/uL (ref 1.7–7.7)
Neutrophils Relative %: 73 %
Platelets: 201 10*3/uL (ref 150–400)
RBC: 4.44 MIL/uL (ref 3.87–5.11)
RDW: 14 % (ref 11.5–15.5)
WBC: 7.1 10*3/uL (ref 4.0–10.5)
nRBC: 0 % (ref 0.0–0.2)

## 2020-08-07 LAB — SARS CORONAVIRUS 2 (TAT 6-24 HRS): SARS Coronavirus 2: POSITIVE — AB

## 2020-08-07 LAB — TROPONIN I (HIGH SENSITIVITY): Troponin I (High Sensitivity): <2 ng/L (ref <18)

## 2020-08-07 MED ORDER — PREDNISONE 10 MG PO TABS: 40.0000 mg | ORAL_TABLET | Freq: Every day | ORAL | 0 refills | Status: AC

## 2020-08-07 MED ORDER — ALBUTEROL SULFATE HFA 108 (90 BASE) MCG/ACT IN AERS
4.0000 | INHALATION_SPRAY | Freq: Once | RESPIRATORY_TRACT | Status: AC
  Administered 2020-08-07: 4 via RESPIRATORY_TRACT
  Filled 2020-08-07: qty 6.7

## 2020-08-07 MED ORDER — NIRMATRELVIR/RITONAVIR (PAXLOVID)TABLET: 3.0000 | ORAL_TABLET | Freq: Two times a day (BID) | ORAL | 0 refills | Status: AC

## 2020-08-07 MED ORDER — PREDNISONE 50 MG PO TABS
60.0000 mg | ORAL_TABLET | Freq: Once | ORAL | Status: AC
  Administered 2020-08-07: 60 mg via ORAL
  Filled 2020-08-07: qty 1

## 2020-08-07 NOTE — ED Triage Notes (Signed)
Presents today with waking up with wheezing, hx of asthma, states used inhaler yesterday but with very little relief. Has child at home with covid.

## 2020-08-07 NOTE — ED Notes (Signed)
X-ray at bedside

## 2020-08-07 NOTE — ED Notes (Signed)
Patient denies pain and is resting comfortably.  

## 2020-08-07 NOTE — ED Provider Notes (Signed)
[facility] Provider Note   CSN: [id number] Arrival date & time: 08/07/20  9030     History Chief Complaint  Patient presents with  . Shortness of Breath    Rebecca Blackburn is a 35 y.o. female.  HPI      35 year old female with history of asthma, hypertension, migraines for which she sees Dr. Collins Scotland of neurology., microdiscectomy of L5-S1 April 15 by Dr. Gerilyn Nestle who presents with concern for shortness of breath.  History of asthma Coughing and wheezing began yesterday, temporary relief with inhaler but would come back Triggered by pollen, allergens Scheduled to see asthma dr in June When woke up this morning had shortness of breath, just moving and talking, oldest child tested positive for COVID  Now taking albuterol but not on other medication No runny nose or fever No sore throat No chest pain, feels like burning, similar to prior asthma No new leg swelling, has had discomfort off and on for 8 in the left leg Not on OCP, no hx of dvt or pe   Past Medical History:  Diagnosis Date  . Asthma   . High blood pressure   . Migraine   . Varicella     Patient Active Problem List   Diagnosis Date Noted  . Migraine with aura and without status migrainosus, not intractable 03/18/2019  . Seborrheic dermatitis 02/13/2019  . Dizziness 01/13/2019  . 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 ASCUS of cervix with negative high risk HPV 11/20/2018  . Encounter for gynecological examination without abnormal finding 11/07/2018  . Genital warts 09/30/2018  . History of loop electrical excision procedure (LEEP) 09/30/2018  . 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 Essential hypertension 01/23/2018  . GERD with esophagitis 01/23/2018  . Migraine without aura and without status migrainosus, not intractable 01/23/2018  . Morbid obesity due to excess calories ([place]) 01/23/2018    Past Surgical History:  Procedure Laterality Date  . CESAREAN SECTION     2016  and 2006  . LUMBAR MICRODISCECTOMY  06/24/2020  . SKIN BIOPSY    . TONSILLECTOMY       OB History   No obstetric history on file.     Family History  Problem Relation Age of Onset  . Hypertension Mother   . Thyroid disease Father   . Diabetes Maternal Grandmother   . Diabetes Maternal Grandfather   . Migraines Neg Hx     Social History   Tobacco Use  . Smoking status: Never Smoker  . Smokeless tobacco: Never Used  Vaping Use  . Vaping Use: Never used  Substance Use Topics  . Alcohol use: Yes    Comment: occasional  . Drug use: Never    Home Medications Prior to Admission medications   Medication Sig Start Date End Date Taking? Authorizing Provider  albuterol (VENTOLIN HFA) 108 (90 Base) MCG/ACT inhaler INHALE 2 PUFFS 3 TIMES A DAY 05/19/16  Yes [provider]  losartan (COZAAR) 100 MG tablet Take by mouth. 11/02/19  Yes [provider]  nirmatrelvir/ritonavir EUA (PAXLOVID) TABS Take 3 tablets by mouth 2 (two) times daily for 5 days. Patient GFR is 60.Take nirmatrelvir (150 mg) two tablets twice daily for 5 days and ritonavir (100 mg) one tablet twice daily for 5 days. 08/07/20 08/12/20 Yes Gareth Morgan, MD  predniSONE (DELTASONE) 10 MG tablet Take 4 tablets (40 mg total) by mouth daily for 4 days. 08/07/20 08/11/20 Yes Gareth Morgan, MD  acyclovir (ZOVIRAX) 400 MG tablet  Take 400 mg by mouth 2 (two) times daily. 11/13/19   [provider]  Atogepant (QULIPTA) 60 MG TABS Take 60 mg by mouth daily. 07/25/20   Melvenia Beam, MD  CLOBETASOL PROPIONATE EX Apply topically.    [provider]  cyclobenzaprine (FLEXERIL) 10 MG tablet Take 1 tablet (10 mg total) by mouth 2 (two) times daily as needed for muscle spasms. 09/30/19   Mesner, Corene Cornea, MD  DESONIDE EX Apply topically.    [provider]  famotidine (PEPCID) 40 MG tablet Take 1 tablet (40 mg total) by mouth as needed for heartburn or indigestion. 09/30/19   Mesner, Corene Cornea, MD   fluticasone (FLONASE) 50 MCG/ACT nasal spray 2 sprays by Each Nare route daily. 11/11/19   [provider]  ketorolac (TORADOL) 10 MG tablet Take 1 tablet (10 mg total) by mouth every 8 (eight) hours as needed. 01/31/20   Rayna Sexton, PA-C  naproxen (NAPROSYN) 500 MG tablet Take by mouth. 06/16/19   [provider]  rizatriptan (MAXALT-MLT) 10 MG disintegrating tablet Take 1 tablet (10 mg total) by mouth as needed for migraine. May repeat in 2 hours if needed 03/18/19   Melvenia Beam, MD  tiZANidine (ZANAFLEX) 4 MG tablet Take 4 mg by mouth every 6 (six) hours as needed. 11/27/19   [provider]  valACYclovir (VALTREX) 500 MG tablet Take 500 mg by mouth daily. Takes as needed    [provider]  Vitamin D, Ergocalciferol, (DRISDOL) 1.25 MG (50000 UNIT) CAPS capsule Take 50,000 Units by mouth once a week. 05/28/19   [provider]  FLUoxetine (PROZAC) 10 MG capsule Take by mouth. 07/30/19 09/30/19  [provider]    Allergies    Nortriptyline, Amoxicillin, Cefaclor, and Cefuroxime axetil  Review of Systems   Review of Systems  Constitutional: Negative for fever.  HENT: Negative for sore throat.   Eyes: Negative for visual disturbance.  Respiratory: Positive for cough, shortness of breath and wheezing.   Cardiovascular: Negative for chest pain.  Gastrointestinal: Negative for abdominal pain, diarrhea, nausea and vomiting.  Genitourinary: Negative for difficulty urinating.  Musculoskeletal: Negative for back pain and neck pain.  Skin: Negative for rash.  Neurological: Negative for syncope and headaches.    Physical Exam Updated Vital Signs BP 114/71 (BP Location: Left Arm)   Pulse 87   Temp 98.4 F (36.9 C) (Oral)   Resp 18   Ht 5\' 1"  (1.549 m)   Wt 117.9 kg   SpO2 100%   BMI 49.13 kg/m   Physical Exam Vitals and nursing note reviewed.  Constitutional:      General: She is not in acute distress.    Appearance: She is  well-developed. She is not diaphoretic.  HENT:     Head: Normocephalic and atraumatic.  Eyes:     Conjunctiva/sclera: Conjunctivae normal.  Cardiovascular:     Rate and Rhythm: Normal rate and regular rhythm.     Heart sounds: Normal heart sounds. No murmur heard. No friction rub. No gallop.   Pulmonary:     Effort: Pulmonary effort is normal. No respiratory distress.     Breath sounds: Normal breath sounds. No wheezing or rales.  Abdominal:     General: There is no distension.     Palpations: Abdomen is soft.     Tenderness: There is no abdominal tenderness. There is no guarding.  Musculoskeletal:        General: No tenderness.     Cervical back:  Normal range of motion.  Skin:    General: Skin is warm and dry.     Findings: No erythema or rash.  Neurological:     Mental Status: She is alert and oriented to person, place, and time.     ED Results / Procedures / Treatments   Labs (all labs ordered are listed, but only abnormal results are displayed) Labs Reviewed  SARS CORONAVIRUS 2 (TAT 6-24 HRS) - Abnormal; Notable for the following components:      Result Value   SARS Coronavirus 2 POSITIVE (*)    All other components within normal limits  COMPREHENSIVE METABOLIC PANEL - Abnormal; Notable for the following components:   Glucose, Bld 101 (*)    Calcium 8.2 (*)    All other components within normal limits  CBC WITH DIFFERENTIAL/PLATELET  TROPONIN I (HIGH SENSITIVITY)  TROPONIN I (HIGH SENSITIVITY)    EKG EKG Interpretation  Date/Time:  Sunday Aug 07 2020 08:46:13 EDT Ventricular Rate:  82 PR Interval:  156 QRS Duration: 94 QT Interval:  388 QTC Calculation: 454 R Axis:   66 Text Interpretation: Sinus rhythm Low voltage, precordial leads No significant change since last tracing Confirmed by Denzal Meir ([id number]) on 08/07/2020 9:32:04 AM   Radiology DG Chest Portable 1 View  Result Date: 08/07/2020 CLINICAL DATA:  Shortness of breath EXAM: PORTABLE CHEST 1  VIEW COMPARISON:  09/30/2019 FINDINGS: Normal heart size and mediastinal contours. No acute infiltrate or edema. No effusion or pneumothorax. No acute osseous findings. IMPRESSION: Negative chest. Electronically Signed   By: Jonathon  Watts M.D.   On: 08/07/2020 08:45    Procedures Procedures   Medications Ordered in ED Medications  predniSONE (DELTASONE) tablet 60 mg (60 mg Oral Given 08/07/20 0903)  albuterol (VENTOLIN HFA) 108 (90 Base) MCG/ACT inhaler 4 puff (4 puffs Inhalation Given 08/07/20 0848)    ED Course  I have reviewed the triage vital signs and the nursing notes.  Pertinent labs & imaging results that were available during my care of the patient were reviewed by me and considered in my medical decision making (see chart for details).    MDM Rules/Calculators/A&P                           34  year old female with history of asthma, hypertension, migraines for which she sees Dr. Collins Scotland of neurology., microdiscectomy of L5-S1 April 15 by Dr. Gerilyn Nestle who presents with concern for shortness of breath and cough.  EKG without acute ST changes or signs of pericarditis.  Chest x-ray shows no evidence of pneumonia, pulmonary edema, or pneumothorax.  Labs obtained show no evidence of anemia, or significant electrolyte abnormality.  She has had symptoms like this with prior asthma.  Discussed possibility of evaluation with D-dimer, and follow-up VQ scan if positive, but given symptoms consistent with prior asthma exacerbations, no hypoxia, no acute asymmetric lower extremity symptoms, agree that we have low suspicion for PE and will treat symptomatically as asthma exacerbation.  Given steroid in the ED and albuterol and prescription for same.  Asthma may be triggered by viral infection such as COVID 19 or allergies. Daughter did test positive for COVID and given high likelihood, wrote rx for paxlovid given high risk in case testing returns positive.  No hypoxia or respiratory distress or  indication for admission. Patient discharged in stable condition with understanding of reasons to return.     Final Clinical Impression(s) / ED Diagnoses Final diagnoses:  Mild intermittent asthma with exacerbation  Close exposure to COVID-19 virus  Cough    Rx / DC Orders ED Discharge Orders         Ordered    predniSONE (DELTASONE) 10 MG tablet  Daily        08/07/20 0936    nirmatrelvir/ritonavir EUA (PAXLOVID) TABS  2 times daily        08/07/20 5015           Gareth Morgan, MD 08/08/20 2123

## 2020-08-09 NOTE — Telephone Encounter (Signed)
I called [facility] @ [phone] and spoke with Andee Poles to change the site location. Site location has been changed to [facility]. I have sent the orders to [facility]. She will reach out to patient to schedule. Reference #[id number].

## 2020-09-06 DIAGNOSIS — Z8616 Personal history of COVID-19: Secondary | ICD-10-CM | POA: Insufficient documentation

## 2020-09-06 DIAGNOSIS — J4520 Mild intermittent asthma, uncomplicated: Secondary | ICD-10-CM | POA: Insufficient documentation

## 2020-09-08 ENCOUNTER — Telehealth: Payer: Self-pay | Admitting: Neurology

## 2020-09-08 NOTE — Telephone Encounter (Addendum)
Tried to reach the patient to discuss results.  Received no answer and patient's voicemail has not been set up.  We will try again later.

## 2020-09-08 NOTE — Telephone Encounter (Signed)
MRI of the cervical spine and brain essentially normal.  Some very minimal arthritic changes in the spine that are common at patient's age but the spinal cord is normal and no nerve pinching seen.  MRI of the brain is normal, some incidental minimal paranasal sinus disease noted but MRI of the brain normal.  See scanned.

## 2020-09-13 NOTE — Telephone Encounter (Signed)
Spoke with patient and discussed MRI brain and c-spine results.  The patient stated that she recently found that she has an infection in her tooth and bone on the right side.  She is taking antibiotics.  She states the infection was so bad it ate part of her bone.  The dentist did not elect to remove the tooth at the time due to it being close to a sinus and ?nerve and instead referred to an oral surgeon where she will be seen on the 12th.  Due to her question that her tooth infection could be related to/causing her dizziness and headaches and her hesitancy to start any medications that she may not need, pt has not started the [place] yet.  She hopes to have more answers by the time she follows up with Dr. Jaynee Eagles next month. Patient verbalized appreciation for the call.

## 2020-09-21 ENCOUNTER — Other Ambulatory Visit: Payer: Self-pay

## 2020-09-21 ENCOUNTER — Encounter: Payer: Self-pay | Admitting: Emergency Medicine

## 2020-09-21 ENCOUNTER — Ambulatory Visit
Admission: EM | Admit: 2020-09-21 | Discharge: 2020-09-21 | Disposition: A | Discharge status: Home / Self Care | Payer: Medicaid Other | Attending: Internal Medicine | Admitting: Internal Medicine

## 2020-09-21 DIAGNOSIS — R109 Unspecified abdominal pain: Secondary | ICD-10-CM | POA: Diagnosis not present

## 2020-09-21 DIAGNOSIS — R350 Frequency of micturition: Secondary | ICD-10-CM

## 2020-09-21 LAB — POCT URINALYSIS DIP (MANUAL ENTRY)
Bilirubin, UA: NEGATIVE
Blood, UA: NEGATIVE
Glucose, UA: NEGATIVE mg/dL
Ketones, POC UA: NEGATIVE mg/dL
Leukocytes, UA: NEGATIVE
Nitrite, UA: NEGATIVE
Protein Ur, POC: NEGATIVE mg/dL
Spec Grav, UA: >=1.03 — AB (ref 1.010–1.025)
Urobilinogen, UA: 0.2 E.U./dL
pH, UA: 5.5 (ref 5.0–8.0)

## 2020-09-21 LAB — POCT URINE PREGNANCY: Preg Test, Ur: NEGATIVE

## 2020-09-21 MED ORDER — METRONIDAZOLE 500 MG PO TABS: 500.0000 mg | ORAL_TABLET | Freq: Two times a day (BID) | ORAL | 0 refills | Status: AC

## 2020-09-21 MED ORDER — FLUCONAZOLE 150 MG PO TABS: 150.0000 mg | ORAL_TABLET | Freq: Once | ORAL | 0 refills | Status: AC

## 2020-09-21 NOTE — ED Triage Notes (Signed)
Patient presents to [facility] for evaluation of 1 week of lower abdominal pressure, urinary frequency, and malodor

## 2020-09-21 NOTE — ED Provider Notes (Signed)
[facility]    CSN: [id number] Arrival date & time: 09/21/20  1328      History   Chief Complaint Chief Complaint  Patient presents with   Dysuria    HPI Rebecca Blackburn is a 35 y.o. female history of asthma presenting today for evaluation of possible UTI.  Reports 1 week of lower abdominal pressure with associated frequency and odor.  Denies significant history of UTIs.  Does report history of BV.  Denies typically having urinary symptoms with BV.  Denies discharge itching or irritation.  Denies fevers nausea or vomiting.  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      Home Medications    Prior to Admission medications   Medication Sig Start Date End Date Taking? Authorizing Provider  fluconazole (DIFLUCAN) 150 MG tablet Take 1 tablet (150 mg total) by mouth once for 1 dose. 09/21/20 09/21/20 Yes Dionicio Shelnutt C, PA-C  metroNIDAZOLE (FLAGYL) 500 MG tablet Take 1 tablet (500 mg total) by  mouth 2 (two) times daily for 7 days. 09/21/20 09/28/20 Yes Jewett Mcgann C, PA-C  acyclovir (ZOVIRAX) 400 MG tablet Take 400 mg by mouth 2 (two) times daily. 11/13/19   [provider]  albuterol (VENTOLIN HFA) 108 (90 Base) MCG/ACT inhaler INHALE 2 PUFFS 3 TIMES A DAY 05/19/16   [provider]  Atogepant (QULIPTA) 60 MG TABS Take 60 mg by mouth daily. 07/25/20   Melvenia Beam, MD  CLOBETASOL PROPIONATE EX Apply topically.    [provider]  cyclobenzaprine (FLEXERIL) 10 MG tablet Take 1 tablet (10 mg total) by mouth 2 (two) times daily as needed for muscle spasms. 09/30/19   Mesner, Corene Cornea, MD  DESONIDE EX Apply topically.    [provider]  famotidine (PEPCID) 40 MG tablet Take 1 tablet (40 mg total) by mouth as needed for heartburn or indigestion. 09/30/19   Mesner, Corene Cornea, MD  fluticasone (FLONASE) 50 MCG/ACT nasal spray 2 sprays by Each Nare route daily. 11/11/19   [provider]  ketorolac (TORADOL) 10 MG tablet Take 1 tablet (10 mg total) by mouth every 8 (eight) hours as needed. 01/31/20   Rayna Sexton, PA-C  losartan (COZAAR) 100 MG tablet Take by mouth. 11/02/19   [provider]  naproxen (NAPROSYN) 500 MG tablet Take by mouth. 06/16/19   [provider]  rizatriptan (MAXALT-MLT) 10 MG disintegrating tablet Take 1 tablet (10 mg total)  by mouth as needed for migraine. May repeat in 2 hours if needed 03/18/19   Melvenia Beam, MD  tiZANidine (ZANAFLEX) 4 MG tablet Take 4 mg by mouth every 6 (six) hours as needed. 11/27/19   [provider]  valACYclovir (VALTREX) 500 MG tablet Take 500 mg by mouth daily. Takes as needed    [provider]  Vitamin D, Ergocalciferol, (DRISDOL) 1.25 MG (50000 UNIT) CAPS capsule Take 50,000 Units by mouth once a week. 05/28/19   [provider]  FLUoxetine (PROZAC) 10 MG capsule Take by mouth. 07/30/19 09/30/19  [provider]    Family History Family History   Problem Relation Age of Onset   Hypertension Mother    Thyroid disease Father    Diabetes Maternal Grandmother    Diabetes Maternal Grandfather    Migraines Neg Hx     Social History Social History   Tobacco Use   Smoking status: Never   Smokeless tobacco: Never  Vaping Use   Vaping Use: Never used  Substance Use Topics   Alcohol use: Yes    Comment: occasional   Drug use: Never     Allergies   Nortriptyline, Amoxicillin, Cefaclor, and Cefuroxime axetil   Review of Systems Review of Systems  Constitutional:  Negative for fever.  Respiratory:  Negative for shortness of breath.   Cardiovascular:  Negative for chest pain.  Gastrointestinal:  Positive for abdominal pain. Negative for diarrhea, nausea and vomiting.  Genitourinary:  Positive for frequency. Negative for dysuria, flank pain, genital sores, hematuria, menstrual problem, vaginal bleeding, vaginal discharge and vaginal pain.  Musculoskeletal:  Negative for back pain.  Skin:  Negative for rash.  Neurological:  Negative for dizziness, light-headedness and headaches.    Physical Exam Triage Vital Signs ED Triage Vitals  Enc Vitals Group     BP 09/21/20 1355 (!) 169/98     Pulse Rate 09/21/20 1355 91     Resp 09/21/20 1355 16     Temp 09/21/20 1355 98.8 F (37.1 C)     Temp Source 09/21/20 1355 Oral     SpO2 09/21/20 1355 98 %     Weight --      Height --      Head Circumference --      Peak Flow --      Pain Score 09/21/20 1358 4     Pain Loc --      Pain Edu? --      Excl. in [place]? --    No data found.  Updated Vital Signs BP (!) 169/98 (BP Location: Right Wrist)   Pulse 91   Temp 98.8 F (37.1 C) (Oral)   Resp 16   LMP 09/04/2020 (Exact Date)   SpO2 98%   Visual Acuity Right Eye Distance:   Left Eye Distance:   Bilateral Distance:    Right Eye Near:   Left Eye Near:    Bilateral Near:     Physical Exam Vitals and nursing note reviewed.  Constitutional:      Appearance: She is  well-developed.     Comments: No acute distress  HENT:     Head: Normocephalic and atraumatic.     Nose: Nose normal.  Eyes:     Conjunctiva/sclera: Conjunctivae normal.  Cardiovascular:     Rate and Rhythm: Normal rate.  Pulmonary:     Effort: Pulmonary effort is normal. No respiratory distress.  Abdominal:     General: There is no distension.  Musculoskeletal:  General: Normal range of motion.     Cervical back: Neck supple.  Skin:    General: Skin is warm and dry.  Neurological:     Mental Status: She is alert and oriented to person, place, and time.     UC Treatments / Results  Labs (all labs ordered are listed, but only abnormal results are displayed) Labs Reviewed  POCT URINALYSIS DIP (MANUAL ENTRY) - Abnormal; Notable for the following components:      Result Value   Spec Grav, UA >=1.030 (*)    All other components within normal limits  POCT URINE PREGNANCY  CERVICOVAGINAL ANCILLARY ONLY    EKG   Radiology No results found.  Procedures Procedures (including critical care time)  Medications Ordered in UC Medications - No data to display  Initial Impression / Assessment and Plan / UC Course  I have reviewed the triage vital signs and the nursing notes.  Pertinent labs & imaging results that were available during my care of the patient were reviewed by me and considered in my medical decision making (see chart for details).     UA negative for leuks and nitrites, negative hemoglobin, vaginal swab pending to screen for any vaginal infection contributing to urinary symptoms and cramping.  Higher suspicion is BV as cause of symptoms, empirically treating for this with Flagyl and Diflucan.  We will call with results and alter treatment as needed.  Tylenol ibuprofen or Aleve for cramping.  Push fluids.Discussed strict return precautions. Patient verbalized understanding and is agreeable with plan.  Final Clinical Impressions(s) / UC Diagnoses   Final  diagnoses:  Urinary frequency  Abdominal cramping     Discharge Instructions      Urine normal Vaginal swab pending to screen for vaginal infections Begin metronidazole twice daily x1 week to treat for bacterial vaginosis Diflucan if needed for yeast     ED Prescriptions     Medication Sig Dispense Auth. Provider   metroNIDAZOLE (FLAGYL) 500 MG tablet Take 1 tablet (500 mg total) by mouth 2 (two) times daily for 7 days. 14 tablet Izick Gasbarro C, PA-C   fluconazole (DIFLUCAN) 150 MG tablet Take 1 tablet (150 mg total) by mouth once for 1 dose. 2 tablet Latanja Lehenbauer, [place] C, PA-C      PDMP not reviewed this encounter.   Janith Lima, PA-C 09/21/20 1442

## 2020-09-21 NOTE — Discharge Instructions (Addendum)
Urine normal Vaginal swab pending to screen for vaginal infections Begin metronidazole twice daily x1 week to treat for bacterial vaginosis Diflucan if needed for yeast

## 2020-09-22 LAB — CERVICOVAGINAL ANCILLARY ONLY
Bacterial Vaginitis (gardnerella): NEGATIVE
Candida Glabrata: NEGATIVE
Candida Vaginitis: NEGATIVE
Chlamydia: NEGATIVE
Comment: NEGATIVE
Comment: NEGATIVE
Comment: NEGATIVE
Comment: NEGATIVE
Comment: NEGATIVE
Comment: NORMAL
Neisseria Gonorrhea: NEGATIVE
Trichomonas: NEGATIVE

## 2020-10-03 ENCOUNTER — Ambulatory Visit: Payer: Medicaid Other | Admitting: Family Medicine

## 2020-10-19 ENCOUNTER — Other Ambulatory Visit: Payer: Self-pay

## 2020-10-19 ENCOUNTER — Ambulatory Visit: Payer: Medicaid Other | Admitting: Neurology

## 2020-10-19 ENCOUNTER — Encounter: Payer: Self-pay | Admitting: Neurology

## 2020-10-19 VITALS — BP 110/70 | HR 70

## 2020-10-19 DIAGNOSIS — G8929 Other chronic pain: Secondary | ICD-10-CM | POA: Insufficient documentation

## 2020-10-19 DIAGNOSIS — M7918 Myalgia, other site: Secondary | ICD-10-CM | POA: Insufficient documentation

## 2020-10-19 DIAGNOSIS — G43109 Migraine with aura, not intractable, without status migrainosus: Secondary | ICD-10-CM

## 2020-10-19 DIAGNOSIS — M542 Cervicalgia: Secondary | ICD-10-CM

## 2020-10-19 MED ORDER — KETOROLAC TROMETHAMINE 10 MG PO TABS: 10.0000 mg | ORAL_TABLET | Freq: Three times a day (TID) | ORAL | 11 refills | Status: DC | PRN

## 2020-10-19 MED ORDER — RIZATRIPTAN BENZOATE 10 MG PO TBDP: 10.0000 mg | ORAL_TABLET | ORAL | 11 refills | Status: DC | PRN
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
## 2020-10-19 NOTE — Patient Instructions (Signed)
Continue maxalt

## 2020-11-29 ENCOUNTER — Other Ambulatory Visit: Payer: Self-pay

## 2020-11-29 ENCOUNTER — Ambulatory Visit: Payer: Medicaid Other | Attending: Neurology

## 2020-11-29 DIAGNOSIS — G4486 Cervicogenic headache: Secondary | ICD-10-CM | POA: Diagnosis present

## 2020-11-29 DIAGNOSIS — R293 Abnormal posture: Secondary | ICD-10-CM | POA: Insufficient documentation

## 2020-11-29 DIAGNOSIS — M542 Cervicalgia: Secondary | ICD-10-CM | POA: Insufficient documentation

## 2020-11-29 NOTE — Therapy (Addendum)
[facility] [address] [place], [place], [address] Phone: [phone]   Fax:  [phone]  Physical Therapy Evaluation  Patient Details  Name: Rebecca Blackburn MRN: [id number] Date of Birth: September 06, 1985 Referring Provider (PT): Sarina Ill MD   Encounter Date: 11/29/2020   PT End of Session - 11/29/20 1015     Visit Number 1    Number of Visits 9    Date for PT Re-Evaluation 01/03/21    Authorization Type [place] [facility]    Authorization Time Period 11/29/20-02/02/21    Progress Note Due on Visit 9    PT Start Time 1015    PT Stop Time 1100    PT Time Calculation (min) 45 min             Past Medical History:  Diagnosis Date   Asthma    High blood pressure    Migraine    Migraines    Varicella     Past Surgical History:  Procedure Laterality Date   CESAREAN SECTION     2016 and 2006   LUMBAR MICRODISCECTOMY  06/24/2020   SKIN BIOPSY     TONSILLECTOMY     tooth removal Right     There were no vitals filed for this visit.    Subjective Assessment - 11/29/20 1019     Subjective Describes a gradual onset of upper thoracic/lower cervical symptoms over the past 12 years, described as muscle tension, achiness and frequent HAs. Has had prior OPPT but benefits most from dry needling.    Pertinent History referral info-Iontophoresis - 4 mg/ml of dexamethasone  T.E.N.S. Unit Evaluation and Dispense as Indicated  Patient to continue dry needling for cervical myofascial pain syndrome    Limitations House hold activities    How long can you sit comfortably? >30 min    How long can you stand comfortably? <30 min    How long can you walk comfortably? <30    Patient Stated Goals To improve cervical mobility and dimish pain    Currently in Pain? Yes    Pain Score 7     Pain Location Neck    Pain Descriptors / Indicators Aching    Pain Type Chronic pain    Pain Onset More than a month ago    Pain Frequency Constant     Aggravating Factors  activity    Pain Relieving Factors dry needling                [facility] PT Assessment - 11/29/20 0001       Assessment   Medical Diagnosis Cervicalgia    Referring Provider (PT) Sarina Ill MD    Hand Dominance Right    Next MD Visit PRN    Prior Therapy OPPT      Precautions   Precautions None      Restrictions   Weight Bearing Restrictions No      Balance Screen   Has the patient fallen in the past 6 months No      [place] residence    Living Arrangements Children    Type of Home Apartment    Home Access Stairs to enter      Prior Function   Level of Independence Independent      Sensation   Light Touch Appears Intact      AROM   Cervical Flexion 45    Cervical Extension 60    Cervical - Right  Side Bend 45    Cervical - Left Side Bend 45    Cervical - Right Rotation 70    Cervical - Left Rotation 70      Palpation   Spinal mobility restricted in lowed cervical/upper thoracic regions      Transfers   Transfers Sit to Stand;Stand to Sit    Sit to Stand 7: Independent    Stand to Sit 7: Independent      Ambulation/Gait   Ambulation/Gait Yes    Ambulation/Gait Assistance 7: Independent    Ambulation Distance (Feet) 100 Feet    Assistive device None    Gait Pattern Within Functional Limits    Ambulation Surface Level;Indoor    Gait velocity 0.72m/s             Able to maintain all 4 positions on mCTSIB for 30s           Objective measurements completed on examination: See above findings.                  PT Short Term Goals - 11/29/20 1514       PT SHORT TERM GOAL #1   Title Patient to demo HEP back to PT    Baseline cervical retractions    Time 4    Period Weeks    Status New    Target Date 01/03/21      PT SHORT TERM GOAL #2   Title Pt will demonstrate 5 deg increase in cervical SB, rot and F/E    Baseline Intial AROM 45d B SB, 70d B rotation,  45/60d F/E    Time 4    Period Weeks    Status New    Target Date 01/03/21      PT SHORT TERM GOAL #3   Title Pt will report </= 5/10 pain in shoulders/neck overall    Baseline 7/10    Time 4    Period Weeks    Status New    Target Date 01/03/21               PT Long Term Goals - 11/29/20 1518       PT LONG TERM GOAL #1   Title Patient will be I in final HEP    Baseline TBD    Time 8    Period Weeks    Status New    Target Date 01/31/21      PT LONG TERM GOAL #2   Title Increase gait velocity to 1.3 m/s to establish apprpriate speed for age and gender    Baseline 0.92 m/s    Time 8    Period Weeks    Status New    Target Date 01/31/21      PT LONG TERM GOAL #3   Title Improve upper t-spine/lower c-spine mobility as evidenced by increased cervical ROM all planes, especially flexion    Baseline 45d cervical flex    Time 8    Period Weeks    Status New    Target Date 01/31/21              12/08/20 1007  Plan  Clinical Impression Statement Patient referred to OPPT for continued issues with HAs and cervical pain and dysfunction.  She denies radiating symptoms at this time.  Cervical AROM is [facility] for B rotation but resttricted in B SB and F/E suggestived of lower cervical, upper thoracic restrictions to mobility,  Palpation finds marked soft tissue restrictions throughout lower  cervical and upper thoracic regions, tenderness to B scalene groups and levator.  Patient is a good candiate for OPPT to regain cervical and upper thoracic mobility and improve pain.  Personal Factors and Comorbidities Comorbidity 1  Comorbidities migraines  Examination-Activity Limitations Lift  Examination-Participation Restrictions Occupation  Pt will benefit from skilled therapeutic intervention in order to improve on the following deficits Decreased range of motion;Increased fascial restricitons;Decreased endurance;Increased muscle spasms;Decreased activity tolerance;Pain;Impaired  flexibility  Stability/Clinical Decision Making Stable/Uncomplicated  Clinical Decision Making Low  Rehab Potential Good  PT Frequency 1x / week  PT Duration 8 weeks  PT Treatment/Interventions ADLs/Self Care Home Management;Aquatic Therapy;DME Instruction;Neuromuscular re-education;Balance training;Therapeutic exercise;Therapeutic activities;Functional mobility training;Gait training;Stair training;Patient/family education  PT Next Visit Plan establish HEP as appropriate, begin dry needling as appropriate  PT Home Exercise Plan TBD  Consulted and Agree with Plan of Care Patient           Patient will benefit from skilled therapeutic intervention in order to improve the following deficits and impairments:     Visit Diagnosis: Cervicalgia - Plan: PT plan of care cert/re-cert  Cervicogenic headache - Plan: PT plan of care cert/re-cert  Abnormal posture - Plan: PT plan of care cert/re-cert     Problem List Patient Active Problem List   Diagnosis Date Noted   Chronic neck pain 10/19/2020   Myofascial pain syndrome, cervical 10/19/2020   Migraine with aura and without status migrainosus, not intractable 03/18/2019   Seborrheic dermatitis 02/13/2019   Dizziness 01/13/2019   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 [place], [place], [address] Phone: [phone]   Fax:   [phone]  Name: Rebecca Blackburn MRN: [id number] Date of Birth: 10/03/1985

## 2020-12-08 ENCOUNTER — Ambulatory Visit: Payer: Medicaid Other | Admitting: Physical Therapy

## 2020-12-08 ENCOUNTER — Other Ambulatory Visit: Payer: Self-pay

## 2020-12-08 DIAGNOSIS — M542 Cervicalgia: Secondary | ICD-10-CM | POA: Diagnosis not present

## 2020-12-08 NOTE — Therapy (Signed)
[facility] [address] [place], [place], [address] Phone: [phone]   Fax:  [phone]  Physical Therapy Treatment  Patient Details  Name: Rebecca Blackburn MRN: [id number] Date of Birth: 1985-05-12 Referring Provider (PT): Sarina Ill MD   Encounter Date: 12/08/2020   PT End of Session - 12/08/20 1027     Visit Number 2    Number of Visits 9    Date for PT Re-Evaluation 01/03/21    Authorization Type [place] [facility]    Authorization Time Period 11/29/20-02/02/21    Progress Note Due on Visit 9    PT Start Time 1024    PT Stop Time 1105    PT Time Calculation (min) 41 min    Activity Tolerance Patient tolerated treatment well    Behavior During Therapy [facility] for tasks assessed/performed             Past Medical History:  Diagnosis Date   Asthma    High blood pressure    Migraine    Migraines    Varicella     Past Surgical History:  Procedure Laterality Date   CESAREAN SECTION     2016 and 2006   LUMBAR MICRODISCECTOMY  06/24/2020   SKIN BIOPSY     TONSILLECTOMY     tooth removal Right     There were no vitals filed for this visit.   Subjective Assessment - 12/08/20 1029     Subjective Dizziness and HA are much better since tooth was pulled.  Still having tightness in neck.  Had a lumbar microdiscectomy due to ongoing symptoms down the LE.  Low back pain is improved.    Pertinent History referral info-Iontophoresis - 4 mg/ml of dexamethasone  T.E.N.S. Unit Evaluation and Dispense as Indicated  Patient to continue dry needling for cervical myofascial pain syndrome    Limitations House hold activities    How long can you sit comfortably? >30 min    How long can you stand comfortably? <30 min    How long can you walk comfortably? <30    Patient Stated Goals To improve cervical mobility and dimish pain    Pain Onset More than a month ago               [facility] Adult PT Treatment/Exercise - 12/08/20  1121       Manual Therapy   Manual Therapy Joint mobilization;Soft tissue mobilization;Other (comment)    Manual therapy comments Performed in prone after dry needling to improve thoracic spine mobility    Joint Mobilization Grade II/III bilateral and unilateral PA mobilizations to upper, middle and lower thoracic spine to improve extension and rotation mobility    Soft tissue mobilization STM to R thoracic paraspinal mm after dry needling    Other Manual Therapy Demonstrated to pt how to perform mid back rounding stretch with arms reaching forwards              Trigger Point Dry Needling - 12/08/20 1106     Consent Given? Yes    Education Handout Provided Previously provided    Muscles Treated Back/Hip Thoracic multifidi;Erector spinae    Dry Needling Comments Performed in prone to R side, mid-lower thoracic spine    Erector spinae Response Twitch response elicited;Palpable increased muscle length    Thoracic multifidi response Twitch response elicited;Palpable increased muscle length                PT Education - 12/08/20 1130  Education Details Mid back rounding stretch with UE reaching forwards; plan for next two sessions.  Possible reasons for why mid back would be experiencing increased tension after low back surgery.    Person(s) Educated Patient    Methods Explanation    Comprehension Verbalized understanding              PT Short Term Goals - 11/29/20 1514       PT SHORT TERM GOAL #1   Title Patient to demo HEP back to PT    Baseline cervical retractions    Time 4    Period Weeks    Status New    Target Date 01/03/21      PT SHORT TERM GOAL #2   Title Pt will demonstrate 5 deg increase in cervical SB, rot and F/E    Baseline Intial AROM 45d B SB, 70d B rotation, 45/60d F/E    Time 4    Period Weeks    Status New    Target Date 01/03/21      PT SHORT TERM GOAL #3   Title Pt will report </= 5/10 pain in shoulders/neck overall    Baseline  7/10    Time 4    Period Weeks    Status New    Target Date 01/03/21               PT Long Term Goals - 11/29/20 1518       PT LONG TERM GOAL #1   Title Patient will be I in final HEP    Baseline TBD    Time 8    Period Weeks    Status New    Target Date 01/31/21      PT LONG TERM GOAL #2   Title Increase gait velocity to 1.3 m/s to establish apprpriate speed for age and gender    Baseline 0.92 m/s    Time 8    Period Weeks    Status New    Target Date 01/31/21      PT LONG TERM GOAL #3   Title Improve upper t-spine/lower c-spine mobility as evidenced by increased cervical ROM all planes, especially flexion    Baseline 45d cervical flex    Time 8    Period Weeks    Status New    Target Date 01/31/21                   Plan - 12/08/20 1124     Clinical Impression Statement Pt reporting greatest tension, pain and decreased mobility in thoracic region, on R side.  Addressed with trigger point dry needling, mobilizations and demonstrated mid back stretch to patient.  Pt reporting slight improvement in pain at end of session.  Will continue to address and will initiate HEP next session.    Rehab Potential Good    PT Frequency 1x / week    PT Duration 8 weeks    PT Treatment/Interventions ADLs/Self Care Home Management;Aquatic Therapy;DME Instruction;Neuromuscular re-education;Balance training;Therapeutic exercise;Therapeutic activities;Functional mobility training;Gait training;Stair training;Patient/family education;Manual techniques;Passive range of motion;Dry needling;Cryotherapy;Moist Heat    PT Next Visit Plan DN R thoracic paraspinals again as needed.  HEP focusing on thoracic mobility - extension/rotation    Consulted and Agree with Plan of Care Patient             Patient will benefit from skilled therapeutic intervention in order to improve the following deficits and impairments:  Decreased range of motion, Increased fascial restricitons,  Decreased endurance, Increased muscle spasms, Decreased activity tolerance, Pain, Impaired flexibility  Visit Diagnosis: Cervicalgia     Problem List Patient Active Problem List   Diagnosis Date Noted   Chronic neck pain 10/19/2020   Myofascial pain syndrome, cervical 10/19/2020   Migraine with aura and without status migrainosus, not intractable 03/18/2019   Seborrheic dermatitis 02/13/2019   Dizziness 01/13/2019   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 [place], [place], [address] Phone: [phone]   Fax:  [phone]  Name: Rebecca Blackburn MRN: [id number] Date of Birth: 1985-10-17

## 2020-12-08 NOTE — Addendum Note (Signed)
Addended by: Rico Junker on: 12/08/2020 05:02 PM   Modules accepted: Orders

## 2020-12-12 ENCOUNTER — Emergency Department (HOSPITAL_BASED_OUTPATIENT_CLINIC_OR_DEPARTMENT_OTHER)
Admission: EM | Admit: 2020-12-12 | Discharge: 2020-12-12 | Disposition: A | Discharge status: Home / Self Care | Payer: Medicaid Other | Attending: Emergency Medicine | Admitting: Emergency Medicine

## 2020-12-12 ENCOUNTER — Other Ambulatory Visit: Payer: Self-pay

## 2020-12-12 ENCOUNTER — Encounter (HOSPITAL_BASED_OUTPATIENT_CLINIC_OR_DEPARTMENT_OTHER): Payer: Self-pay

## 2020-12-12 DIAGNOSIS — M545 Low back pain, unspecified: Secondary | ICD-10-CM

## 2020-12-12 DIAGNOSIS — Z7951 Long term (current) use of inhaled steroids: Secondary | ICD-10-CM | POA: Insufficient documentation

## 2020-12-12 DIAGNOSIS — I10 Essential (primary) hypertension: Secondary | ICD-10-CM | POA: Diagnosis not present

## 2020-12-12 DIAGNOSIS — J45909 Unspecified asthma, uncomplicated: Secondary | ICD-10-CM | POA: Insufficient documentation

## 2020-12-12 DIAGNOSIS — Z79899 Other long term (current) drug therapy: Secondary | ICD-10-CM | POA: Insufficient documentation

## 2020-12-12 DIAGNOSIS — M546 Pain in thoracic spine: Secondary | ICD-10-CM | POA: Diagnosis not present

## 2020-12-12 LAB — URINALYSIS, ROUTINE W REFLEX MICROSCOPIC
Bilirubin Urine: NEGATIVE
Glucose, UA: NEGATIVE mg/dL
Hgb urine dipstick: NEGATIVE
Ketones, ur: NEGATIVE mg/dL
Leukocytes,Ua: NEGATIVE
Nitrite: NEGATIVE
Protein, ur: NEGATIVE mg/dL
Specific Gravity, Urine: 1.03 (ref 1.005–1.030)
pH: 5.5 (ref 5.0–8.0)

## 2020-12-12 LAB — PREGNANCY, URINE: Preg Test, Ur: NEGATIVE

## 2020-12-12 MED ORDER — HYDROCODONE-ACETAMINOPHEN 5-325 MG PO TABS
1.0000 | ORAL_TABLET | Freq: Once | ORAL | Status: AC
  Administered 2020-12-12: 1 via ORAL
  Filled 2020-12-12: qty 1

## 2020-12-12 MED ORDER — LIDOCAINE 5 % EX PTCH
1.0000 | MEDICATED_PATCH | CUTANEOUS | Status: DC
  Administered 2020-12-12: 1 via TRANSDERMAL
  Filled 2020-12-12: qty 1

## 2020-12-12 MED ORDER — METHOCARBAMOL 500 MG PO TABS: 500.0000 mg | ORAL_TABLET | Freq: Two times a day (BID) | ORAL | 0 refills | Status: DC

## 2020-12-12 NOTE — ED Provider Notes (Signed)
[facility] Provider Note   CSN: [id number] Arrival date & time: 12/12/20  1241     History Chief Complaint  Patient presents with   Back Pain    Rebecca Blackburn is a 35 y.o. female with a past medical history significant for asthma, hypertension, and history of migraines who presents to the ED due to middle and low back pain that has progressively worsened over the past week.  Patient states she had dry needling performed last week and notes her back pain worsened after.  Pain is worse with movement and when raising her arms.  Patient states she has been dealing with back pain for numerous months and has been undergoing physical therapy.  Patient has tried Robaxin, IcyHot, and over-the-counter pain medications with no relief.  Denies saddle paresthesias, bowel/bladder incontinence, lower extremity numbness/tingling, lower extremity weakness, fever/chills, IV drug use, and history of cancer.  Denies chest pain and shortness of breath.  No lower extremity edema.  No cough or fever. She also endorses some dysuria.   History obtained from patient and past medical records. No interpreter used during encounter.       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   ASCUS of cervix with negative high risk HPV 11/20/2018   Encounter for gynecological examination without abnormal finding 11/07/2018   Genital warts 09/30/2018   History of loop electrical excision procedure (LEEP) 09/30/2018   Arthritis of neck 01/23/2018   Essential hypertension 01/23/2018   GERD with esophagitis  01/23/2018   Migraine without aura and without status migrainosus, not intractable 01/23/2018   Morbid obesity due to excess calories ([place]) 01/23/2018    Past Surgical History:  Procedure Laterality Date   CESAREAN SECTION     2016 and 2006   LUMBAR MICRODISCECTOMY  06/24/2020   SKIN BIOPSY     TONSILLECTOMY     tooth removal Right      OB History   No obstetric history on file.     Family History  Problem Relation Age of Onset   Hypertension Mother    Thyroid disease Father    Diabetes Maternal Grandmother    Diabetes Maternal Grandfather    Migraines Neg Hx     Social History   Tobacco Use   Smoking status: Never   Smokeless tobacco: Never  Vaping Use   Vaping Use: Never used  Substance Use Topics   Alcohol use: Yes    Comment: occasional   Drug use: Never    Home Medications Prior to Admission medications   Medication Sig Start Date End Date Taking? Authorizing Provider  methocarbamol (ROBAXIN) 500 MG tablet Take 1 tablet (500 mg total) by mouth 2 (two) times daily. 12/12/20  Yes Zaida Reiland, Druscilla Brownie, PA-C  acyclovir (ZOVIRAX) 400 MG tablet Take 400 mg by mouth 2 (two) times daily. 11/13/19   [provider]  albuterol (VENTOLIN HFA) 108 (90 Base) MCG/ACT inhaler INHALE 2 PUFFS 3 TIMES A DAY 05/19/16   [provider]  CLOBETASOL PROPIONATE EX Apply topically.    [provider]  cyclobenzaprine (FLEXERIL) 10 MG tablet Take 1 tablet (10 mg total) by mouth 2 (two) times daily as needed for muscle spasms. 09/30/19   Mesner, Corene Cornea, MD  DESONIDE EX Apply topically.    [provider]  famotidine (PEPCID) 40 MG tablet Take 1 tablet (40 mg total) by mouth as needed for heartburn or indigestion. 09/30/19   Mesner, Corene Cornea, MD  fluticasone (FLONASE) 50 MCG/ACT nasal spray 2 sprays by Each Nare route daily. 11/11/19   [provider]  HYDROCHLOROTHIAZIDE PO Take by mouth daily. Pt unaware of dose    [provider]  ketorolac  (TORADOL) 10 MG tablet Take 1 tablet (10 mg total) by mouth every 8 (eight) hours as needed. 10/19/20   Melvenia Beam, MD  losartan (COZAAR) 100 MG tablet Take by mouth. 11/02/19   [provider]  naproxen (NAPROSYN) 500 MG tablet Take by mouth. 06/16/19   [provider]  rizatriptan (MAXALT-MLT) 10 MG disintegrating tablet Take 1 tablet (10 mg total) by mouth as needed for migraine. May repeat in 2 hours if needed 10/19/20   Melvenia Beam, MD  tiZANidine (ZANAFLEX) 4 MG tablet Take 4 mg by mouth every 6 (six) hours as needed. 11/27/19   [provider]  valACYclovir (VALTREX) 500 MG tablet Take 500 mg by mouth daily. Takes as needed    [provider]  Vitamin D, Ergocalciferol, (DRISDOL) 1.25 MG (50000 UNIT) CAPS capsule Take 50,000 Units by mouth once a week. 05/28/19   [provider]    Allergies    Nortriptyline, Amoxicillin, Cefaclor, and Cefuroxime axetil  Review of Systems   Review of Systems  Constitutional:  Negative for chills and fever.  Respiratory:  Negative for shortness of breath.   Cardiovascular:  Negative for chest pain.  Gastrointestinal:  Negative for abdominal pain, diarrhea, nausea and vomiting.  Genitourinary:  Positive for dysuria.  Musculoskeletal:  Positive for back pain.  Neurological:  Negative for weakness and numbness.  All other systems reviewed and are negative.  Physical Exam Updated Vital Signs BP 125/69 (BP Location: Right Arm)   Pulse 85   Temp 98.1 F (36.7 C) (Oral)   Resp 17   Ht 5\' 1"  (1.549 m)   Wt 117.9 kg   LMP 12/05/2020 (Approximate)   SpO2 100%   BMI 49.13 kg/m   Physical Exam Vitals and nursing note reviewed.  Constitutional:      General: She is not in acute distress.    Appearance: She is not ill-appearing.  HENT:     Head: Normocephalic.  Eyes:     Pupils: Pupils are equal, round, and reactive to light.  Cardiovascular:     Rate and Rhythm: Normal rate and regular rhythm.      Pulses: Normal pulses.     Heart sounds: Normal heart sounds. No murmur heard.   No friction rub. No gallop.  Pulmonary:     Effort: Pulmonary effort is normal.     Breath sounds: Normal breath sounds.  Abdominal:     General: Abdomen is flat. There is no distension.     Palpations: Abdomen is soft.     Tenderness: There is no abdominal tenderness. There is no guarding or rebound.  Musculoskeletal:        General: Normal range of motion.     Cervical back: Neck supple.     Comments: No thoracic or lumbar midline tenderness.  Reproducible tenderness to right thoracic and lumbar paraspinal  regions.  Bilateral lower extremities neurovascularly intact.  Patient able to ambulate in the ED without difficulty.  Skin:    General: Skin is warm and dry.  Neurological:     General: No focal deficit present.     Mental Status: She is alert.  Psychiatric:        Mood and Affect: Mood normal.        Behavior: Behavior normal.    ED Results / Procedures / Treatments   Labs (all labs ordered are listed, but only abnormal results are displayed) Labs Reviewed  URINALYSIS, ROUTINE W REFLEX MICROSCOPIC - Abnormal; Notable for the following components:      Result Value   APPearance CLOUDY (*)    All other components within normal limits  PREGNANCY, URINE    EKG None  Radiology No results found.  Procedures Procedures   Medications Ordered in ED Medications  lidocaine (LIDODERM) 5 % 1 patch (has no administration in time range)  HYDROcodone-acetaminophen (NORCO/VICODIN) 5-325 MG per tablet 1 tablet (1 tablet Oral Given 12/12/20 1405)    ED Course  I have reviewed the triage vital signs and the nursing notes.  Pertinent labs & imaging results that were available during my care of the patient were reviewed by me and considered in my medical decision making (see chart for details).    MDM Rules/Calculators/A&P                          35 year old female presents to the ED due  to right-sided back pain.  History of same.  Patient is currently undergoing physical therapy and recently had dry needling done to her back.  Patient denies saddle paresthesias, bowel/bladder incontinence, lower extremity numbness/urine, lower extremity weakness, fever/chills, IV drug use.  Reproducible thoracic and lumbar paraspinal tenderness.  No thoracic or lumbar midline tenderness.  Bilateral lower extremities neurovascularly intact with soft compartments.  Patient will be ambulated in the ED without difficulty.  Low suspicion for cauda equina or central cord compression.  UA negative for signs of infection.  Doubt pyelonephritis. Suspect muscular etiology. Low suspicion for lower lobe pneumonia or PE/DVT. Patient discharged with Robaxin and recommendations to use over-the-counter Lidoderm patches and Voltaren gel for added pain relief.  Low back exercises given to patient. Strict ED precautions discussed with patient. Patient states understanding and agrees to plan. Patient discharged home in no acute distress and stable vitals.  Final Clinical Impression(s) / ED Diagnoses Final diagnoses:  Acute right-sided low back pain without sciatica  Acute right-sided thoracic back pain    Rx / DC Orders ED Discharge Orders          Ordered    methocarbamol (ROBAXIN) 500 MG tablet  2 times daily        12/12/20 1512             Karie Kirks 12/12/20 1513    Isla Pence, MD 12/12/20 1529

## 2020-12-12 NOTE — ED Triage Notes (Signed)
Pt has been complaining of lower back spasming x several months. Went to PT and had dry needling done to right lower back. States pain has worsened since then. Took a muscle relaxer with some relief.

## 2020-12-12 NOTE — Discharge Instructions (Addendum)
It was a pleasure taking care of you today.  As discussed, your urine did not show any signs of infection.  I suspect your back pain is related to a muscular strain.  I am sending you home with more Robaxin.  You may also purchase over-the-counter Lidoderm patches and Voltaren gel for added pain relief.  Please follow-up with PCP symptoms not improve over the next week.

## 2020-12-14 NOTE — Addendum Note (Signed)
Addended by: Lanice Shirts on: 12/14/2020 10:50 AM   Modules accepted: Orders

## 2020-12-15 ENCOUNTER — Other Ambulatory Visit: Payer: Self-pay

## 2020-12-15 ENCOUNTER — Ambulatory Visit: Payer: Medicaid Other | Attending: Neurology | Admitting: Physical Therapy

## 2020-12-15 DIAGNOSIS — M542 Cervicalgia: Secondary | ICD-10-CM | POA: Insufficient documentation

## 2020-12-15 DIAGNOSIS — R293 Abnormal posture: Secondary | ICD-10-CM | POA: Insufficient documentation

## 2020-12-16 NOTE — Therapy (Signed)
[facility] [address] [place], [place], [address] Phone: [phone]   Fax:  [phone]  Physical Therapy Treatment  Patient Details  Name: Rebecca Blackburn MRN: [id number] Date of Birth: [birth year] Referring Provider (PT): Sarina Ill MD   Encounter Date: 12/15/2020   PT End of Session - 12/15/20 1152     Visit Number 3    Number of Visits 9    Date for PT Re-Evaluation 01/03/21    Authorization Type [facility]    Authorization Time Period 11/29/20-02/02/21    PT Start Time 1152    PT Stop Time 3532    PT Time Calculation (min) 43 min    Activity Tolerance Patient limited by pain    Behavior During Therapy [facility] [address] for tasks assessed/performed             Past Medical History:  Diagnosis Date   Asthma    High blood pressure    Migraine    Migraines    Varicella     Past Surgical History:  Procedure Laterality Date   CESAREAN SECTION     2016 and 2006   LUMBAR MICRODISCECTOMY  06/24/2020   SKIN BIOPSY     TONSILLECTOMY     tooth removal Right     There were no vitals filed for this visit.   Subjective Assessment - 12/15/20 1154     Subjective Pt felt good after last session but the next day she experienced sudden increase in mid/lower back pain on R side; increased in severity over the next 3 days.  Pt sought care at ED - no imaging performed.  Pt given mm relaxers.  Pt reports she has been taking mm relaxers regularly in order to function.    Pertinent History referral info-Iontophoresis - 4 mg/ml of dexamethasone  T.E.N.S. Unit Evaluation and Dispense as Indicated  Patient to continue dry needling for cervical myofascial pain syndrome    Limitations House hold activities    How long can you sit comfortably? >30 min    How long can you stand comfortably? <30 min    How long can you walk comfortably? <30    Patient Stated Goals To improve cervical mobility and dimish pain    Currently in Pain? Yes     Pain Score 8     Pain Location Back    Pain Orientation Right;Lower    Pain Descriptors / Indicators Spasm    Pain Onset In the past 7 days             Discussed pt's response to dry needling and symptoms that led her to seek care at ED.  Pt reports experiencing significant mm spasms with sit > stand, when initiating ambulation, trunk extension and rotation.  Pt reported pain would make it hard to breathe, but denied any SOB or pain with deep breathing.  Educated pt on dry needling risk when needling in lung field and discussed training required to perform needling in lung field and techniques used to prevent injury to lung field.  Pt's onset of symptoms and symptom behavior do not appear to indicate injury to lung field but will continue to assess and monitor.  Pt did not wish to perform dry needling again today.  Pt also reporting area was "swollen" after needling.  Pt denied any redness or warmth.  PT inspected area of previous needling.  No wound, redness, edema or warmth noted.  Performed palpation over area of previous needling with  no tenderness or pain reported.  Pt reporting tenderness and pain below level of needling - around T11-T12 area.     Pt placed in supine with LE elevated in flexion.  Guided pt through diaphragmatic breathing and began to include posterior pelvic tilts for increased core activation.  Add to breathing and tilts - 8 reps single knee to chest on R.  Also in supine with breathing added lower trunk rotations to L and R then focused on rotation to L.  Cued pt to perform extended exhale while therapist provided slight overpressure to gradually increase tissue extensibility and ROM. Returned to sitting and performed the following exercises for ROM: seated trunk roll down and roll ups initiating movement at pelvis and decreasing over-activation of lumbar extensors; seated R lateral trunk flexion with overhead reach; seated trunk rotations to L with UE support; lower  thoracic and upper lumbar paraspinal tissue mobilization with massage ball against the wall performing vertically and horizontal mobilization.  Provided pt with massage ball to borrow at home until next session.     PT Education - 12/16/20 1017     Education Details Exercises and self tissue mobilization techniques to address mm spasms    Person(s) Educated Patient    Methods Explanation;Demonstration    Comprehension Verbalized understanding;Returned demonstration              PT Short Term Goals - 11/29/20 1514       PT SHORT TERM GOAL #1   Title Patient to demo HEP back to PT    Baseline cervical retractions    Time 4    Period Weeks    Status New    Target Date 01/03/21      PT SHORT TERM GOAL #2   Title Pt will demonstrate 5 deg increase in cervical SB, rot and F/E    Baseline Intial AROM 45d B SB, 70d B rotation, 45/60d F/E    Time 4    Period Weeks    Status New    Target Date 01/03/21      PT SHORT TERM GOAL #3   Title Pt will report </= 5/10 pain in shoulders/neck overall    Baseline 7/10    Time 4    Period Weeks    Status New    Target Date 01/03/21               PT Long Term Goals - 11/29/20 1518       PT LONG TERM GOAL #1   Title Patient will be I in final HEP    Baseline TBD    Time 8    Period Weeks    Status New    Target Date 01/31/21      PT LONG TERM GOAL #2   Title Increase gait velocity to 1.3 m/s to establish apprpriate speed for age and gender    Baseline 0.92 m/s    Time 8    Period Weeks    Status New    Target Date 01/31/21      PT LONG TERM GOAL #3   Title Improve upper t-spine/lower c-spine mobility as evidenced by increased cervical ROM all planes, especially flexion    Baseline 45d cervical flex    Time 8    Period Weeks    Status New    Target Date 01/31/21                   Plan - 12/15/20 1153  Clinical Impression Statement Due to severity of pain/spasms and reaction to previous dry needling  session, dry needling discontinued at this time.  Focused on use of breathing, core activation, exercises, self tissue mobilization to continue to address muscle tension/spasm, pain and decreased ROM.  Pt tolerated these exercises well; recommended pt continue to perform these exercises and tissue mobilization in combination with walking for exercise until next session.  Will continue to assess response to treatment and will continue to address as pt is able to tolerate.    Rehab Potential Good    PT Frequency 1x / week    PT Duration 8 weeks    PT Treatment/Interventions ADLs/Self Care Home Management;Aquatic Therapy;DME Instruction;Neuromuscular re-education;Balance training;Therapeutic exercise;Therapeutic activities;Functional mobility training;Gait training;Stair training;Patient/family education;Manual techniques;Passive range of motion;Dry needling;Cryotherapy;Moist Heat    PT Next Visit Plan Provide pictures for HEP focusing on core activation, thoracic mobility - extension/rotation    Consulted and Agree with Plan of Care Patient             Patient will benefit from skilled therapeutic intervention in order to improve the following deficits and impairments:  Decreased range of motion, Increased fascial restricitons, Decreased endurance, Increased muscle spasms, Decreased activity tolerance, Pain, Impaired flexibility  Visit Diagnosis: Cervicalgia  Abnormal posture     Problem List Patient Active Problem List   Diagnosis Date Noted   Chronic neck pain 10/19/2020   Myofascial pain syndrome, cervical 10/19/2020   Migraine with aura and without status migrainosus, not intractable 03/18/2019   Seborrheic dermatitis 02/13/2019   Dizziness 01/13/2019   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   ASCUS of cervix with negative high risk HPV 11/20/2018   Encounter for gynecological examination without abnormal finding 11/07/2018   Genital warts 09/30/2018   History of loop  electrical excision procedure (LEEP) 09/30/2018   Arthritis of neck 01/23/2018   Essential hypertension 01/23/2018   GERD with esophagitis 01/23/2018   Migraine without aura and without status migrainosus, not intractable 01/23/2018   Morbid obesity due to excess calories ([place]) 01/23/2018   Rico Junker, PT, DPT 12/16/20    10:19 AM  [facility] [address] [place], [place], [address] Phone: [phone]   Fax:  [phone]  Name: Rebecca Blackburn MRN: [id number] Date of Birth: 10-21-1985

## 2020-12-20 ENCOUNTER — Ambulatory Visit: Payer: Medicaid Other | Admitting: Physical Therapy

## 2020-12-20 ENCOUNTER — Other Ambulatory Visit: Payer: Self-pay

## 2020-12-20 DIAGNOSIS — R293 Abnormal posture: Secondary | ICD-10-CM

## 2020-12-20 DIAGNOSIS — M542 Cervicalgia: Secondary | ICD-10-CM

## 2020-12-20 NOTE — Patient Instructions (Signed)
Stretching Routine:  -On hands and knees: flat back <> CAT while breathing out gently - 10   -Half thread the needle - lead with elbow and reach through halfway (breathing out) - 5 to each side  -In half-V sitting - lean towards straight leg, reach opposite arm over head to stretch lower side.  Do on both right and left sides, 2 times.  -Sitting edge of bed - reach one hand over opposite knee, other hand behind you - breathe out slowly and turn towards that knee, gently.  Switch hands and turn towards the other side gently while breathing out.    -Keeping using the massage ball as needed

## 2020-12-21 NOTE — Therapy (Signed)
[facility] [address] [place], [place], [address] Phone: [phone]   Fax:  [phone]  Physical Therapy Treatment and Discharge  Patient Details  Name: Rebecca Blackburn MRN: [id number] Date of Birth: [birth year] Referring Provider (PT): Sarina Ill MD   Encounter Date: 12/20/2020   PT End of Session - 12/20/20 1328     Visit Number 4    Number of Visits 9    Date for PT Re-Evaluation 01/03/21    Authorization Type [facility] MCD    Authorization Time Period 11/29/20-02/02/21    PT Start Time 1325    PT Stop Time 1401    PT Time Calculation (min) 36 min    Activity Tolerance Patient limited by pain    Behavior During Therapy [facility] for tasks assessed/performed             Past Medical History:  Diagnosis Date   Asthma    High blood pressure    Migraine    Migraines    Varicella     Past Surgical History:  Procedure Laterality Date   CESAREAN SECTION     2016 and 2006   LUMBAR MICRODISCECTOMY  06/24/2020   SKIN BIOPSY     TONSILLECTOMY     tooth removal Right     There were no vitals filed for this visit.   Subjective Assessment - 12/20/20 1329     Subjective Pt reports significant improvement in pain.  Area where PT needled is better and feels like the myofascial ball helped the muscle in the low back.  Not taking muscle relaxers any more.    Still having some pain/discomfort in the side and around the ribs.    Pertinent History referral info-Iontophoresis - 4 mg/ml of dexamethasone  T.E.N.S. Unit Evaluation and Dispense as Indicated  Patient to continue dry needling for cervical myofascial pain syndrome    Limitations House hold activities    How long can you sit comfortably? >30 min    How long can you stand comfortably? <30 min    How long can you walk comfortably? <30    Patient Stated Goals To improve cervical mobility and dimish pain    Currently in Pain? Yes    Pain Score 4     Pain Onset In  the past 7 days            Pt reporting no pain in area where dry needling occurred.  Pain in R lower quadrant still present but is tolerable.  Reviewed the following exercises for pt to continue to perform to address ongoing mm tension, guarding and pain:  Stretching Routine:  -On hands and knees: flat back <> CAT while breathing out gently - 10   -Half thread the needle - lead with elbow and reach through halfway (breathing out) - 5 to each side  -In half-V sitting - lean towards straight leg, reach opposite arm over head to stretch lower side.  Do on both right and left sides, 2 times.  -Sitting edge of bed - reach one hand over opposite knee, other hand behind you - breathe out slowly and turn towards that knee, gently.  Switch hands and turn towards the other side gently while breathing out.    -Keeping using the massage ball as needed    Also performed R thoracic and rib mobilizations: seated with lateral flexion over bolster to L performed R lateral stretch with UE overhead and therapist facilitating rib and trunk elongation +  pelvic depression.  Added in diaphragmatic breathing for increased thoracic expansion and contraction.  Added trunk and thoracic diagonal rotation down and to the L, up and to the R with PT performing mobilizations at mid and lower thoracic spine to increase rotation ROM and provided cues for diaphragmatic breathing.       PT Education - 12/21/20 1035     Education Details Final HEP, D/C today    Person(s) Educated Patient    Methods Explanation;Demonstration;Handout    Comprehension Verbalized understanding;Returned demonstration              PT Short Term Goals - 12/21/20 1018       PT SHORT TERM GOAL #1   Title Patient to demo HEP back to PT    Time 4    Period Weeks    Status Achieved    Target Date 01/03/21      PT SHORT TERM GOAL #2   Title Pt will demonstrate 5 deg increase in cervical SB, rot and F/E    Baseline Intial AROM 45d  B SB, 70d B rotation, 45/60d F/E    Time 4    Period Weeks    Status Unable to assess    Target Date 01/03/21      PT SHORT TERM GOAL #3   Title Pt will report </= 5/10 pain in shoulders/neck overall    Time 4    Period Weeks    Status Achieved    Target Date 01/03/21               PT Long Term Goals - 12/21/20 1026       PT LONG TERM GOAL #1   Title Patient will be I in final HEP    Baseline TBD    Time 8    Period Weeks    Status Achieved      PT LONG TERM GOAL #2   Title Increase gait velocity to 1.3 m/s to establish apprpriate speed for age and gender    Baseline 0.92 m/s    Time 8    Period Weeks    Status Unable to assess      PT LONG TERM GOAL #3   Title Improve upper t-spine/lower c-spine mobility as evidenced by increased cervical ROM all planes, especially flexion    Baseline 45d cervical flex    Time 8    Period Weeks    Status Unable to assess                   Plan - 12/21/20 1022     Clinical Impression Statement Pt is reporting significant improvement in mid and lower back pain with myofascial techniques and HEP.  Continued to review exercises for pt to perform as part of ongoing HEP.  Pt to D/C from therapy at this time due to starting classes next week and will be unable to attend therapy sessions.  Pt is pleased with progress and is ready for D/C.    Rehab Potential Good    PT Frequency 1x / week    PT Duration 8 weeks    PT Treatment/Interventions ADLs/Self Care Home Management;Aquatic Therapy;DME Instruction;Neuromuscular re-education;Balance training;Therapeutic exercise;Therapeutic activities;Functional mobility training;Gait training;Stair training;Patient/family education;Manual techniques;Passive range of motion;Dry needling;Cryotherapy;Moist Heat    PT Next Visit Plan D/C    Consulted and Agree with Plan of Care Patient             Patient will benefit from skilled therapeutic intervention in  order to improve the  following deficits and impairments:  Decreased range of motion, Increased fascial restricitons, Decreased endurance, Increased muscle spasms, Decreased activity tolerance, Pain, Impaired flexibility  Visit Diagnosis: Cervicalgia  Abnormal posture     Problem List Patient Active Problem List   Diagnosis Date Noted   Chronic neck pain 10/19/2020   Myofascial pain syndrome, cervical 10/19/2020   Migraine with aura and without status migrainosus, not intractable 03/18/2019   Seborrheic dermatitis 02/13/2019   Dizziness 01/13/2019   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   ASCUS of cervix with negative high risk HPV 11/20/2018   Encounter for gynecological examination without abnormal finding 11/07/2018   Genital warts 09/30/2018   History of loop electrical excision procedure (LEEP) 09/30/2018   Arthritis of neck 01/23/2018   Essential hypertension 01/23/2018   GERD with esophagitis 01/23/2018   Migraine without aura and without status migrainosus, not intractable 01/23/2018   Morbid obesity due to excess calories ([place]) 01/23/2018   PHYSICAL THERAPY DISCHARGE SUMMARY  Visits from Start of Care: 4  Current functional level related to goals / functional outcomes: See goal achievement and impression statement above regarding progress.   Remaining deficits: Mild R lower quadrant and pain around ribs   Education / Equipment: Final HEP, myofascial massage ball   Patient agrees to discharge. Patient goals were partially met. Patient is being discharged due to being pleased with the current functional level.   [facility] [address] [place], [place], [address] Phone: [phone]   Fax:  [phone]  Name: Rebecca Blackburn MRN: [id number] Date of Birth: 09/03/1985

## 2020-12-27 ENCOUNTER — Ambulatory Visit: Payer: Medicaid Other | Admitting: Physical Therapy

## 2021-01-02 ENCOUNTER — Encounter: Payer: Medicaid Other | Admitting: Physical Therapy

## 2021-01-13 ENCOUNTER — Encounter: Payer: Medicaid Other | Admitting: Physical Therapy

## 2021-01-16 ENCOUNTER — Encounter: Payer: Medicaid Other | Admitting: Physical Therapy

## 2021-01-23 ENCOUNTER — Encounter: Payer: Medicaid Other | Admitting: Physical Therapy

## 2021-07-10 IMAGING — DX DG CHEST 1V PORT
1 series · 1 of 1 positions shown · non-contrast
Comparison: None.

CLINICAL DATA: Back pain

EXAM:
PORTABLE CHEST 1 VIEW

[chest ap]
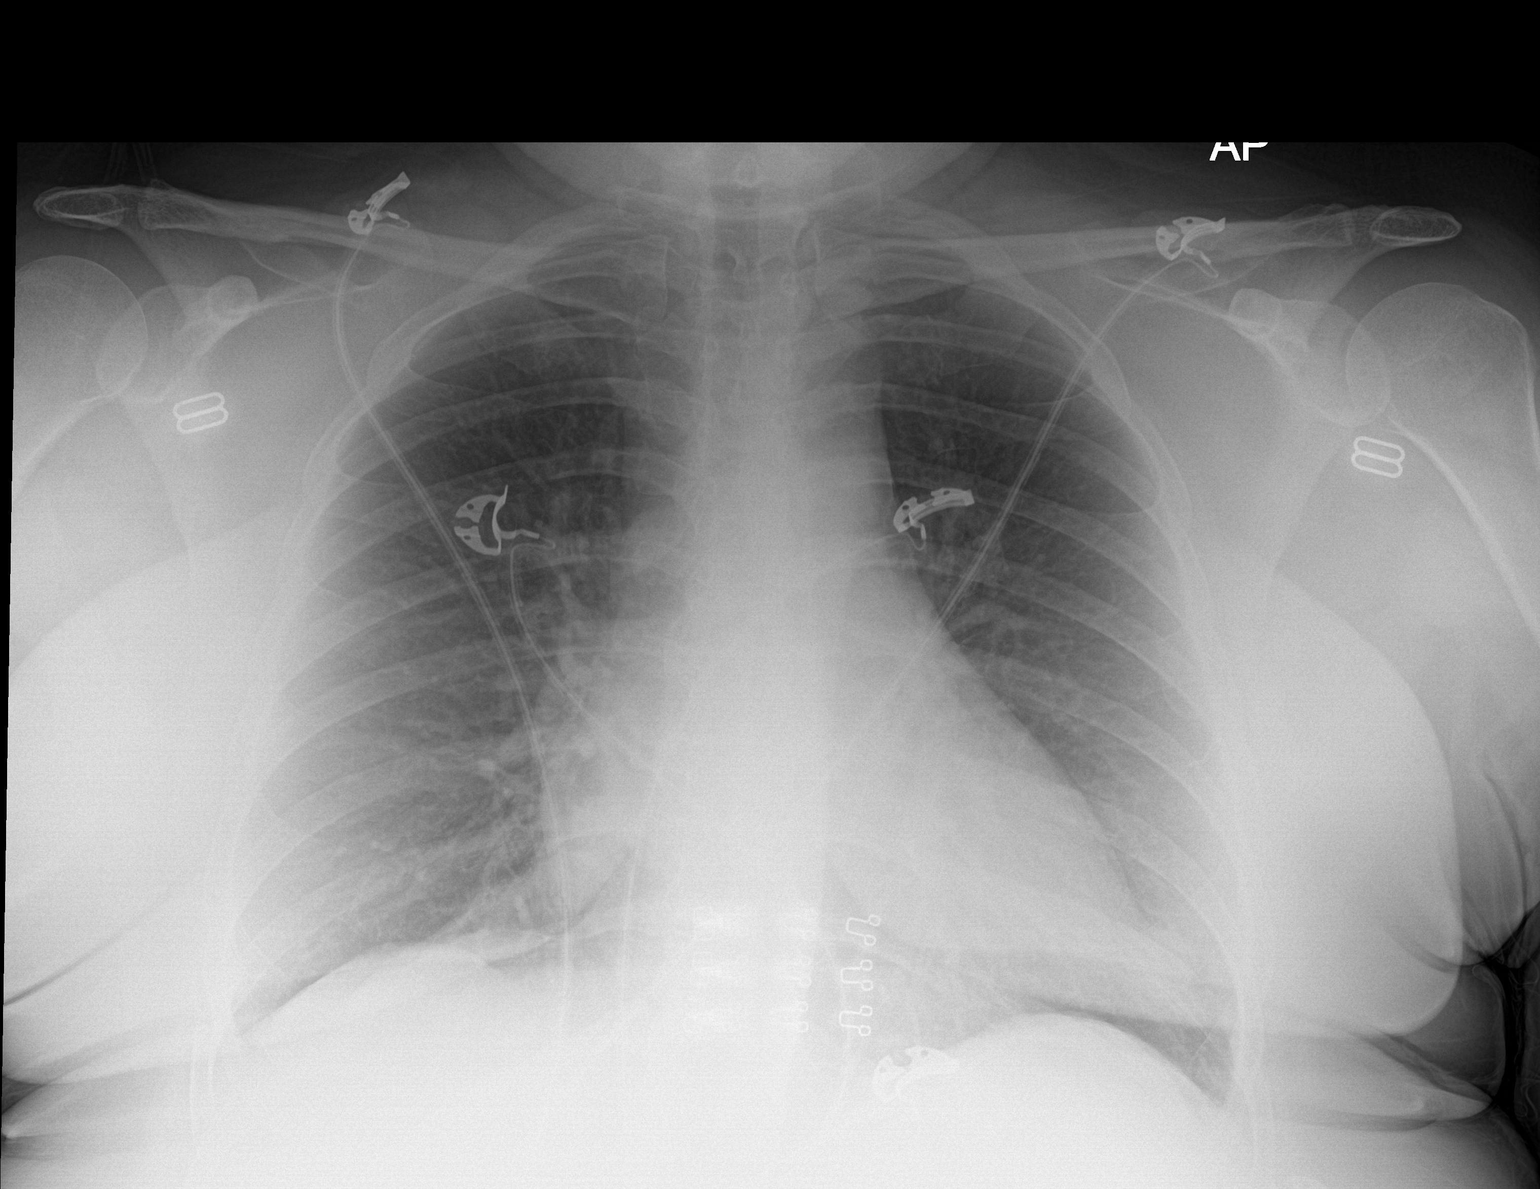

[1 of 1 positions shown; findings below may reference images not displayed]

FINDINGS: The heart size and mediastinal contours are within normal limits.
Both lungs are clear. The visualized skeletal structures are
unremarkable.
IMPRESSION: No active disease.

## 2021-08-22 ENCOUNTER — Ambulatory Visit
Admission: EM | Admit: 2021-08-22 | Discharge: 2021-08-22 | Disposition: A | Discharge status: Home / Self Care | Payer: Medicaid Other

## 2021-08-22 DIAGNOSIS — H66001 Acute suppurative otitis media without spontaneous rupture of ear drum, right ear: Secondary | ICD-10-CM

## 2021-08-22 MED ORDER — AZITHROMYCIN 250 MG PO TABS: ORAL_TABLET | ORAL | 0 refills | Status: AC

## 2021-08-22 NOTE — Discharge Instructions (Signed)
Please begin azithromycin as prescribed for the infection in your right ear.  You are welcome to try ibuprofen for pain.  You may also find that applying ice pack to your ear right side of your head equally effective at reducing inflammation and pain.  Thank you for visiting urgent care today.  Please let us know if you do not have resolution of her symptoms after 10 days.

## 2021-08-22 NOTE — ED Provider Notes (Signed)
[facility]    CSN: [id number] Arrival date & time: 08/22/21  1622    HISTORY   Chief Complaint  Patient presents with   Otalgia   HPI Rebecca Blackburn is a 36 y.o. female. Patient complains of an aching pain in her right ear that radiates to behind her ear, up to her right temple and down the right side of her neck.  Patient states this is not an issue she had in the past.  Patient states she has taken Motrin for her pain which has been somewhat helpful.  Patient denies recent upper respiratory infection, fever, aches, chills, dizziness, hearing loss.  The history is provided by the patient.   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   ASCUS of cervix with negative high risk HPV 11/20/2018   Encounter for gynecological examination without abnormal finding 11/07/2018   Genital warts 09/30/2018   History of loop electrical excision procedure (LEEP) 09/30/2018   Arthritis of neck 01/23/2018   Essential hypertension 01/23/2018   GERD with esophagitis 01/23/2018   Migraine without aura and without status migrainosus, not intractable 01/23/2018   Morbid obesity due to excess calories ([place]) 01/23/2018   Past Surgical History:  Procedure Laterality Date   CESAREAN SECTION     2016 and 2006   LUMBAR MICRODISCECTOMY  06/24/2020   SKIN BIOPSY     TONSILLECTOMY     tooth removal Right    OB History   No obstetric history on file.    Home Medications    Prior to Admission medications   Medication Sig Start Date End Date Taking? Authorizing Provider  azithromycin (ZITHROMAX) 250 MG  tablet Take 2 tablets (500 mg total) by mouth daily for 1 day, THEN 1 tablet (250 mg total) daily for 4 days. 08/22/21 08/27/21 Yes Lynden Oxford Scales, PA-C  acyclovir (ZOVIRAX) 400 MG tablet Take 400 mg by mouth 2 (two) times daily. 11/13/19   [provider]  albuterol (VENTOLIN HFA) 108 (90 Base) MCG/ACT inhaler INHALE 2 PUFFS 3 TIMES A DAY 05/19/16   [provider]  CLOBETASOL PROPIONATE EX Apply topically.    [provider]  DESONIDE EX Apply topically.    [provider]  fluconazole (DIFLUCAN) 150 MG tablet Take 150 mg by mouth every 3 (three) days. 08/17/21   [provider]  losartan (COZAAR) 100 MG tablet Take by mouth. 11/02/19   [provider]  methocarbamol (ROBAXIN) 500 MG tablet Take 1 tablet (500 mg total) by mouth 2 (two) times daily. 12/12/20   Suzy Bouchard, PA-C  valACYclovir (VALTREX) 500 MG tablet Take 500 mg by mouth daily. Takes as needed    [provider]   Family History Family History  Problem Relation Age of Onset   Hypertension Mother    Thyroid disease Father    Diabetes Maternal Grandmother    Diabetes Maternal Grandfather    Migraines Neg Hx    Social History Social History   Tobacco Use   Smoking status: Never   Smokeless tobacco: Never  Vaping Use  Vaping Use: Never used  Substance Use Topics   Alcohol use: Yes    Comment: occasional   Drug use: Never   Allergies   Nortriptyline, Amoxicillin, Cefaclor, Cefuroxime axetil, and Fluticasone  Review of Systems Review of Systems Pertinent findings noted in history of present illness.   Physical Exam Triage Vital Signs ED Triage Vitals  Enc Vitals Group     BP 01/06/21 0827 (!) 147/82     Pulse Rate 01/06/21 0827 72     Resp 01/06/21 0827 18     Temp 01/06/21 0827 98.3 F (36.8 C)     Temp Source 01/06/21 0827 Oral     SpO2 01/06/21 0827 98 %     Weight --      Height --      Head Circumference --      Peak Flow --       Pain Score 01/06/21 0826 5     Pain Loc --      Pain Edu? --      Excl. in [place]? --   No data found.  Updated Vital Signs BP 112/74 (BP Location: Left Arm)   Pulse 88   Temp 98.7 F (37.1 C) (Oral)   Resp 16   LMP 07/29/2021   SpO2 93%   Physical Exam Vitals and nursing note reviewed.  Constitutional:      General: She is not in acute distress.    Appearance: Normal appearance. She is not ill-appearing.  HENT:     Head: Normocephalic and atraumatic.     Salivary Glands: Right salivary gland is not diffusely enlarged or tender. Left salivary gland is not diffusely enlarged or tender.     Right Ear: Hearing and external ear normal. No drainage. No middle ear effusion. There is no impacted cerumen. Tympanic membrane is injected, erythematous and bulging (Mild purulent fluid appreciated behind TM).     Left Ear: Hearing and external ear normal. No drainage.  No middle ear effusion. There is no impacted cerumen. Tympanic membrane is not injected, erythematous or bulging.     Ears:     Comments: Bilateral EACs diffusely red    Nose: Nose normal. No nasal deformity, septal deviation, mucosal edema, congestion or rhinorrhea.     Right Turbinates: Not enlarged, swollen or pale.     Left Turbinates: Not enlarged, swollen or pale.     Right Sinus: No maxillary sinus tenderness or frontal sinus tenderness.     Left Sinus: No maxillary sinus tenderness or frontal sinus tenderness.     Mouth/Throat:     Lips: Pink. No lesions.     Mouth: Mucous membranes are moist. No oral lesions.     Pharynx: Oropharynx is clear. Uvula midline. No posterior oropharyngeal erythema or uvula swelling.     Tonsils: No tonsillar exudate. 0 on the right. 0 on the left.  Eyes:     General: Lids are normal.        Right eye: No discharge.        Left eye: No discharge.     Extraocular Movements: Extraocular movements intact.     Conjunctiva/sclera: Conjunctivae normal.     Right eye: Right conjunctiva is not  injected.     Left eye: Left conjunctiva is not injected.  Neck:     Trachea: Trachea and phonation normal.  Cardiovascular:     Rate and Rhythm: Normal rate and regular rhythm.     Pulses: Normal pulses.  Heart sounds: Normal heart sounds. No murmur heard.    No friction rub. No gallop.  Pulmonary:     Effort: Pulmonary effort is normal. No accessory muscle usage, prolonged expiration or respiratory distress.     Breath sounds: Normal breath sounds. No stridor, decreased air movement or transmitted upper airway sounds. No decreased breath sounds, wheezing, rhonchi or rales.  Chest:     Chest wall: No tenderness.  Musculoskeletal:        General: Normal range of motion.     Cervical back: Normal range of motion and neck supple. Normal range of motion.  Lymphadenopathy:     Cervical: No cervical adenopathy.  Skin:    General: Skin is warm and dry.     Findings: No erythema or rash.  Neurological:     General: No focal deficit present.     Mental Status: She is alert and oriented to person, place, and time.  Psychiatric:        Mood and Affect: Mood normal.        Behavior: Behavior normal.     Visual Acuity Right Eye Distance:   Left Eye Distance:   Bilateral Distance:    Right Eye Near:   Left Eye Near:    Bilateral Near:     UC Couse / Diagnostics / Procedures:    EKG  Radiology No results found.  Procedures Procedures (including critical care time)  UC Diagnoses / Final Clinical Impressions(s)   I have reviewed the triage vital signs and the nursing notes.  Pertinent labs & imaging results that were available during my care of the patient were reviewed by me and considered in my medical decision making (see chart for details).   Final diagnoses:  Acute suppurative otitis media of right ear   Physical exam findings are not in proportion with the patient's description of pain.  Patient advised that I will provide her with a prescription for azithromycin  (given that she is allergic to Ceclor, Ceftin and amoxicillin) and that if she has not improved after 7 to 10 days she should go to the emergency room for further evaluation of abscess.  ED Prescriptions     Medication Sig Dispense Auth. Provider   azithromycin (ZITHROMAX) 250 MG tablet Take 2 tablets (500 mg total) by mouth daily for 1 day, THEN 1 tablet (250 mg total) daily for 4 days. 6 tablet Lynden Oxford Scales, PA-C      PDMP not reviewed this encounter.  Pending results:  Labs Reviewed - No data to display  Medications Ordered in UC: Medications - No data to display  Disposition Upon Discharge:  Condition: stable for discharge home Home: take medications as prescribed; routine discharge instructions as discussed; follow up as advised.  Patient presented with an acute illness with associated systemic symptoms and significant discomfort requiring urgent management. In my opinion, this is a condition that a prudent lay person (someone who possesses an average knowledge of health and medicine) may potentially expect to result in complications if not addressed urgently such as respiratory distress, impairment of bodily function or dysfunction of bodily organs.   Routine symptom specific, illness specific and/or disease specific instructions were discussed with the patient and/or caregiver at length.   As such, the patient has been evaluated and assessed, work-up was performed and treatment was provided in alignment with urgent care protocols and evidence based medicine.  Patient/parent/caregiver has been advised that the patient may require follow up for further testing and  treatment if the symptoms continue in spite of treatment, as clinically indicated and appropriate.  If the patient was tested for COVID-19, Influenza and/or RSV, then the patient/parent/guardian was advised to isolate at home pending the results of his/her diagnostic coronavirus test and potentially longer if  they're positive. I have also advised pt that if his/her COVID-19 test returns positive, it's recommended to self-isolate for at least 10 days after symptoms first appeared AND until fever-free for 24 hours without fever reducer AND other symptoms have improved or resolved. Discussed self-isolation recommendations as well as instructions for household member/close contacts as per the [facility] and [place] [facility], and also gave patient the [place] packet with this information.  Patient/parent/caregiver has been advised to return [facility] or PCP in 3-5 days if no better; to PCP or the Emergency Department if new signs and symptoms develop, or if the current signs or symptoms continue to change or worsen for further workup, evaluation and treatment as clinically indicated and appropriate  The patient will follow up with their current PCP if and as advised. If the patient does not currently have a PCP we will assist them in obtaining one.   The patient may need specialty follow up if the symptoms continue, in spite of conservative treatment and management, for further workup, evaluation, consultation and treatment as clinically indicated and appropriate.  Patient/parent/caregiver verbalized understanding and agreement of plan as discussed.  All questions were addressed during visit.  Please see discharge instructions below for further details of plan.  Discharge Instructions:   Discharge Instructions      Please begin azithromycin as prescribed for the infection in your right ear.  You are welcome to try ibuprofen for pain.  You may also find that applying ice pack to your ear right side of your head equally effective at reducing inflammation and pain.  Thank you for visiting urgent care today.  Please let us know if you do not have resolution of her symptoms after 10 days.    This office note has been dictated using Museum/gallery curator.  Unfortunately, and despite my best efforts, this method of  dictation can sometimes lead to occasional typographical or grammatical errors.  I apologize in advance if this occurs.     Lynden Oxford Scales, PA-C 08/22/21 1742

## 2021-08-22 NOTE — ED Triage Notes (Addendum)
Pt c/o right ear ache that radiates down her shoulder for 3 days. Home interventions: motrin- somewhat helpful  The patient states she is currently taking prescribed diflucan.

## 2021-09-22 ENCOUNTER — Encounter: Payer: Self-pay | Admitting: Neurology

## 2021-09-25 MED ORDER — RIZATRIPTAN BENZOATE 10 MG PO TBDP: ORAL_TABLET | ORAL | 5 refills | Status: DC

## 2021-09-25 MED ORDER — KETOROLAC TROMETHAMINE 10 MG PO TABS: 10.0000 mg | ORAL_TABLET | Freq: Three times a day (TID) | ORAL | 5 refills | Status: DC | PRN

## 2021-12-05 ENCOUNTER — Ambulatory Visit
Admission: EM | Admit: 2021-12-05 | Discharge: 2021-12-05 | Disposition: A | Discharge status: Home / Self Care | Payer: Medicaid Other | Attending: Urgent Care | Admitting: Urgent Care

## 2021-12-05 DIAGNOSIS — B3731 Acute candidiasis of vulva and vagina: Secondary | ICD-10-CM | POA: Insufficient documentation

## 2021-12-05 DIAGNOSIS — R1030 Lower abdominal pain, unspecified: Secondary | ICD-10-CM | POA: Insufficient documentation

## 2021-12-05 DIAGNOSIS — R350 Frequency of micturition: Secondary | ICD-10-CM | POA: Insufficient documentation

## 2021-12-05 LAB — POCT URINALYSIS DIP (MANUAL ENTRY)
Bilirubin, UA: NEGATIVE
Blood, UA: NEGATIVE
Glucose, UA: NEGATIVE mg/dL
Leukocytes, UA: NEGATIVE
Nitrite, UA: NEGATIVE
Protein Ur, POC: NEGATIVE mg/dL
Spec Grav, UA: >=1.03 — AB (ref 1.010–1.025)
Urobilinogen, UA: 0.2 E.U./dL
pH, UA: 6 (ref 5.0–8.0)

## 2021-12-05 LAB — POCT URINE PREGNANCY: Preg Test, Ur: NEGATIVE

## 2021-12-05 MED ORDER — FLUCONAZOLE 150 MG PO TABS: 150.0000 mg | ORAL_TABLET | ORAL | 0 refills | Status: DC

## 2021-12-05 NOTE — ED Triage Notes (Signed)
Pt presents with urinary frequency and lower abdominal spasms. Concerned for uti. Pt also concerned for vaginal itching.

## 2021-12-05 NOTE — Discharge Instructions (Signed)
Please start diflucan to address a yeast infection. Make sure you hydrate very well with plain water and a quantity of 80 ounces of water a day.  Please limit drinks that are considered urinary irritants such as soda, sweet tea, coffee, energy drinks, alcohol.  These can worsen your urinary and genital symptoms but also be the source of them.  I will let you know about your urine culture and vaginal swab results through MyChart to see if we need to prescribe or change your antibiotics based off of those results.

## 2021-12-05 NOTE — ED Provider Notes (Signed)
[facility]  Note:  This document was prepared using Systems analyst and may include unintentional dictation errors.  MRN: [id number] DOB: 1985/06/21  Subjective:   Rebecca Blackburn is a 36 y.o. female presenting for 4 day history of acute onset urinary frequency, urinary urgency, lower abdominal and pelvic pains, vaginal itching, malodorous urine. Has a history of persistent BV infections. Hydrates with ~3 glasses of water daily, has 2 glasses of sweet tea daily, has a Pepsi twice a week. No other drinks.   No current facility-administered medications for this encounter.  Current Outpatient Medications:    acyclovir (ZOVIRAX) 400 MG tablet, Take 400 mg by mouth 2 (two) times daily., Disp: , Rfl:    albuterol (VENTOLIN HFA) 108 (90 Base) MCG/ACT inhaler, INHALE 2 PUFFS 3 TIMES A DAY, Disp: , Rfl:    CLOBETASOL PROPIONATE EX, Apply topically., Disp: , Rfl:    DESONIDE EX, Apply topically., Disp: , Rfl:    fluconazole (DIFLUCAN) 150 MG tablet, Take 150 mg by mouth every 3 (three) days., Disp: , Rfl:    ketorolac (TORADOL) 10 MG tablet, Take 1 tablet (10 mg total) by mouth every 8 (eight) hours as needed., Disp: 20 tablet, Rfl: 5   losartan (COZAAR) 100 MG tablet, Take by mouth., Disp: , Rfl:    methocarbamol (ROBAXIN) 500 MG tablet, Take 1 tablet (500 mg total) by mouth 2 (two) times daily., Disp: 20 tablet, Rfl: 0   rizatriptan (MAXALT-MLT) 10 MG disintegrating tablet, Take one tablet onset migraine, May repeat in 2 hours if needed. (Max 2/24 hours), Disp: 9 tablet, Rfl: 5   valACYclovir (VALTREX) 500 MG tablet, Take 500 mg by mouth daily. Takes as needed, Disp: , Rfl:    Allergies  Allergen Reactions   Nortriptyline Other (See Comments)    Pt reports tightness of chest and felt that her throat was "closing up".    Amoxicillin Rash and Hives   Cefaclor Rash and Hives   Cefuroxime Axetil Rash and Hives   Fluticasone Palpitations    Past Medical  History:  Diagnosis Date   Asthma    High blood pressure    Migraine    Migraines    Varicella      Past Surgical History:  Procedure Laterality Date   CESAREAN SECTION     2016 and 2006   LUMBAR MICRODISCECTOMY  06/24/2020   SKIN BIOPSY     TONSILLECTOMY     tooth removal Right     Family History  Problem Relation Age of Onset   Hypertension Mother    Thyroid disease Father    Diabetes Maternal Grandmother    Diabetes Maternal Grandfather    Migraines Neg Hx     Social History   Tobacco Use   Smoking status: Never   Smokeless tobacco: Never  Vaping Use   Vaping Use: Never used  Substance Use Topics   Alcohol use: Yes    Comment: occasional   Drug use: Never    ROS   Objective:   Vitals: BP 115/77   Pulse 83   Temp 98.5 F (36.9 C)   Resp 18   LMP 11/22/2021   SpO2 97%   Physical Exam Constitutional:      General: She is not in acute distress.    Appearance: Normal appearance. She is well-developed. She is not ill-appearing, toxic-appearing or diaphoretic.  HENT:     Head: Normocephalic and atraumatic.     Nose: Nose normal.  Mouth/Throat:     Mouth: Mucous membranes are moist.  Eyes:     General: No scleral icterus.       Right eye: No discharge.        Left eye: No discharge.     Extraocular Movements: Extraocular movements intact.     Conjunctiva/sclera: Conjunctivae normal.  Cardiovascular:     Rate and Rhythm: Normal rate.  Pulmonary:     Effort: Pulmonary effort is normal.  Abdominal:     General: Bowel sounds are normal. There is no distension.     Palpations: Abdomen is soft. There is no mass.     Tenderness: There is no abdominal tenderness. There is no right CVA tenderness, left CVA tenderness, guarding or rebound.  Skin:    General: Skin is warm and dry.  Neurological:     General: No focal deficit present.     Mental Status: She is alert and oriented to person, place, and time.  Psychiatric:        Mood and Affect:  Mood normal.        Behavior: Behavior normal.        Thought Content: Thought content normal.        Judgment: Judgment normal.     Results for orders placed or performed during the hospital encounter of 12/05/21 (from the past 24 hour(s))  POCT urine pregnancy     Status: None   Collection Time: 12/05/21  5:01 PM  Result Value Ref Range   Preg Test, Ur Negative Negative  POCT urinalysis dipstick     Status: Abnormal   Collection Time: 12/05/21  5:01 PM  Result Value Ref Range   Color, UA straw (A) yellow   Clarity, UA clear clear   Glucose, UA negative negative mg/dL   Bilirubin, UA negative negative   Ketones, POC UA trace (5) (A) negative mg/dL   Spec Grav, UA >=1.030 (A) 1.010 - 1.025   Blood, UA negative negative   pH, UA 6.0 5.0 - 8.0   Protein Ur, POC negative negative mg/dL   Urobilinogen, UA 0.2 0.2 or 1.0 E.U./dL   Nitrite, UA Negative Negative   Leukocytes, UA Negative Negative    Assessment and Plan :   PDMP not reviewed this encounter.  1. Yeast vaginitis   2. Urinary frequency   3. Lower abdominal pain     Recommended covering for yeast vaginitis empirically.  Patient would like to be checked for BV as well given her lower abdominal pains.  She does not have the discharge yet but has a history of BV and feels like she may be developing it.  Emphasized need to hydrate much more consistently with plain water and avoid urinary irritants.  We will treat as appropriate otherwise based off lab results.  Counseled patient on potential for adverse effects with medications prescribed/recommended today, ER and return-to-clinic precautions discussed, patient verbalized understanding.    Jaynee Eagles, [place] 12/05/21 [phone]

## 2021-12-06 LAB — CERVICOVAGINAL ANCILLARY ONLY
Bacterial Vaginitis (gardnerella): POSITIVE — AB
Candida Glabrata: NEGATIVE
Candida Vaginitis: NEGATIVE
Chlamydia: NEGATIVE
Comment: NEGATIVE
Comment: NEGATIVE
Comment: NEGATIVE
Comment: NEGATIVE
Comment: NEGATIVE
Comment: NORMAL
Neisseria Gonorrhea: NEGATIVE
Trichomonas: NEGATIVE

## 2021-12-06 LAB — URINE CULTURE: Culture: NO GROWTH

## 2021-12-07 ENCOUNTER — Telehealth (HOSPITAL_COMMUNITY): Payer: Self-pay | Admitting: Emergency Medicine

## 2021-12-07 MED ORDER — METRONIDAZOLE 500 MG PO TABS: 500.0000 mg | ORAL_TABLET | Freq: Two times a day (BID) | ORAL | 0 refills | Status: DC

## 2022-04-04 ENCOUNTER — Encounter: Payer: Self-pay | Admitting: Neurology

## 2022-04-16 ENCOUNTER — Encounter: Payer: Self-pay | Admitting: Neurology

## 2022-04-16 ENCOUNTER — Ambulatory Visit: Payer: Medicaid Other | Admitting: Neurology

## 2022-04-16 VITALS — BP 120/74 | HR 71 | Ht 61.0 in | Wt 255.8 lb

## 2022-04-16 DIAGNOSIS — H9191 Unspecified hearing loss, right ear: Secondary | ICD-10-CM | POA: Diagnosis not present

## 2022-04-16 DIAGNOSIS — R51 Headache with orthostatic component, not elsewhere classified: Secondary | ICD-10-CM

## 2022-04-16 DIAGNOSIS — H543 Unqualified visual loss, both eyes: Secondary | ICD-10-CM | POA: Diagnosis not present

## 2022-04-16 DIAGNOSIS — G441 Vascular headache, not elsewhere classified: Secondary | ICD-10-CM

## 2022-04-16 DIAGNOSIS — I83813 Varicose 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: Secondary | ICD-10-CM | POA: Insufficient documentation

## 2022-04-16 DIAGNOSIS — R519 Headache, unspecified: Secondary | ICD-10-CM

## 2022-04-16 DIAGNOSIS — H4710 Unspecified papilledema: Secondary | ICD-10-CM
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
## 2022-04-16 NOTE — Addendum Note (Signed)
Addended by: Sarina Ill B on: 04/16/2022 10:42 AM   Modules accepted: Level of Service

## 2022-04-16 NOTE — Addendum Note (Signed)
Addended by: Sarina Ill B on: 04/16/2022 09:43 AM   Modules accepted: Orders

## 2022-04-16 NOTE — Patient Instructions (Signed)
MRI brain and MRA head

## 2022-04-17 LAB — TSH RFX ON ABNORMAL TO FREE T4: TSH: 2.45 u[IU]/mL (ref 0.450–4.500)

## 2022-04-17 LAB — COMPREHENSIVE METABOLIC PANEL WITH GFR
ALT: 11 IU/L (ref 0–32)
AST: 14 IU/L (ref 0–40)
Albumin/Globulin Ratio: 1.5 (ref 1.2–2.2)
Albumin: 4.1 g/dL (ref 3.9–4.9)
Alkaline Phosphatase: 77 IU/L (ref 44–121)
BUN/Creatinine Ratio: 13 (ref 9–23)
BUN: 8 mg/dL (ref 6–20)
Bilirubin Total: 0.2 mg/dL (ref 0.0–1.2)
CO2: 23 mmol/L (ref 20–29)
Calcium: 9 mg/dL (ref 8.7–10.2)
Chloride: 100 mmol/L (ref 96–106)
Creatinine, Ser: 0.6 mg/dL (ref 0.57–1.00)
Globulin, Total: 2.7 g/dL (ref 1.5–4.5)
Glucose: 106 mg/dL — ABNORMAL HIGH (ref 70–99)
Potassium: 3.9 mmol/L (ref 3.5–5.2)
Sodium: 137 mmol/L (ref 134–144)
Total Protein: 6.8 g/dL (ref 6.0–8.5)
eGFR: 118 mL/min/1.73

## 2022-04-17 LAB — CBC WITH DIFFERENTIAL/PLATELET
Basophils Absolute: 0 10*3/uL (ref 0.0–0.2)
Basos: 0 %
EOS (ABSOLUTE): 0.3 10*3/uL (ref 0.0–0.4)
Eos: 4 %
Hematocrit: 37.7 % (ref 34.0–46.6)
Hemoglobin: 12.2 g/dL (ref 11.1–15.9)
Immature Grans (Abs): 0 10*3/uL (ref 0.0–0.1)
Immature Granulocytes: 0 %
Lymphocytes Absolute: 2.5 10*3/uL (ref 0.7–3.1)
Lymphs: 34 %
MCH: 26.6 pg (ref 26.6–33.0)
MCHC: 32.4 g/dL (ref 31.5–35.7)
MCV: 82 fL (ref 79–97)
Monocytes Absolute: 0.3 10*3/uL (ref 0.1–0.9)
Monocytes: 4 %
Neutrophils Absolute: 4.2 10*3/uL (ref 1.4–7.0)
Neutrophils: 58 %
Platelets: 269 10*3/uL (ref 150–450)
RBC: 4.58 x10E6/uL (ref 3.77–5.28)
RDW: 14.9 % (ref 11.7–15.4)
WBC: 7.2 10*3/uL (ref 3.4–10.8)

## 2022-04-29 ENCOUNTER — Encounter: Payer: Self-pay | Admitting: Neurology

## 2022-05-04 ENCOUNTER — Ambulatory Visit (INDEPENDENT_AMBULATORY_CARE_PROVIDER_SITE_OTHER): Payer: Medicaid Other | Admitting: Podiatry

## 2022-05-04 ENCOUNTER — Ambulatory Visit (INDEPENDENT_AMBULATORY_CARE_PROVIDER_SITE_OTHER): Payer: Medicaid Other

## 2022-05-04 DIAGNOSIS — M722 Plantar fascial fibromatosis: Secondary | ICD-10-CM

## 2022-05-04 MED ORDER — TRIAMCINOLONE ACETONIDE 10 MG/ML IJ SUSP
10.0000 mg | Freq: Once | INTRAMUSCULAR | Status: AC
  Administered 2022-05-04: 10 mg

## 2022-05-04 NOTE — Patient Instructions (Signed)

## 2022-05-05 NOTE — Progress Notes (Signed)
Subjective:   Patient ID: Rebecca Blackburn, female   DOB: 37 y.o.   MRN: [id number]   HPI Patient states she has developed acute pain in her left heel over the last 3 months.  She does stand or walk most of the day   ROS      Objective:  Physical Exam  Neurovascular status intact with inflammation pain of the plantar fascia left it is very tender when pressed     Assessment:  Acute fasciitis left with inflammation fluid around the medial band     Plan:  H&P reviewed condition sterile prep and injected the plantar fascia left 3 mg Kenalog 5 mg Xylocaine applied fascial brace to lift the arch gave instructions stretch reappoint as symptoms indicate  X-rays indicate spur formation no indication stress fracture or arthritis

## 2022-05-09 ENCOUNTER — Telehealth: Payer: Self-pay | Admitting: Neurology

## 2022-05-09 NOTE — Telephone Encounter (Signed)
MRA brain-[employer] is requiring a peer to peer be done call [phone] tracking [id number].   MRI brain approved [id number] exp. 04/17/2022-06/16/2022 sent to GI [phone]

## 2022-05-15 ENCOUNTER — Other Ambulatory Visit: Payer: Self-pay | Admitting: Neurology

## 2022-05-15 ENCOUNTER — Encounter: Payer: Self-pay | Admitting: Neurology

## 2022-05-15 DIAGNOSIS — H93A9 Pulsatile tinnitus, unspecified ear: Secondary | ICD-10-CM

## 2022-05-17 ENCOUNTER — Ambulatory Visit: Payer: Medicaid Other | Admitting: Family Medicine

## 2022-05-18 IMAGING — DX DG CHEST 1V PORT
1 series · 1 of 1 positions shown · non-contrast
Comparison: 09/30/2019

CLINICAL DATA: Shortness of breath

EXAM:
PORTABLE CHEST 1 VIEW

[chest ap]
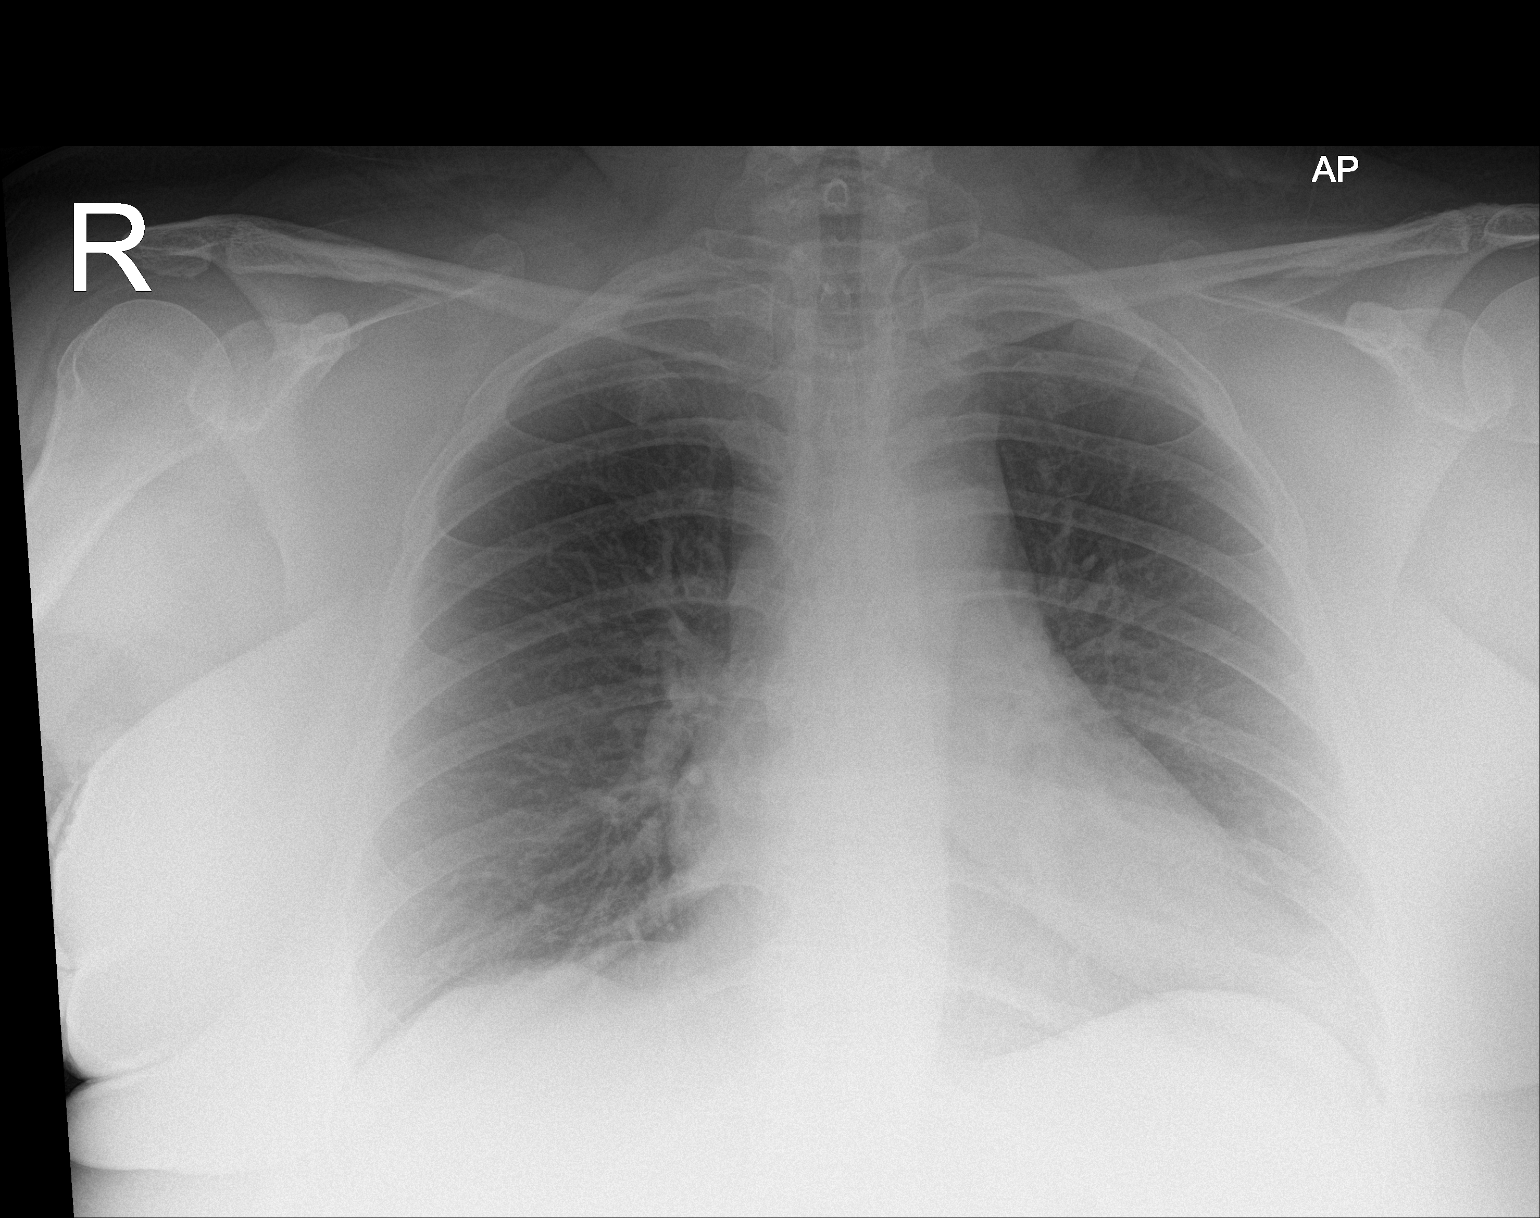

[1 of 1 positions shown; findings below may reference images not displayed]

FINDINGS: Normal heart size and mediastinal contours. No acute infiltrate or
edema. No effusion or pneumothorax. No acute osseous findings.
IMPRESSION: Negative chest.

## 2022-05-23 NOTE — Telephone Encounter (Signed)
CTA is pending faxed notes

## 2022-05-25 ENCOUNTER — Ambulatory Visit
Admission: RE | Admit: 2022-05-25 | Discharge: 2022-05-25 | Disposition: A | Discharge status: Home / Self Care | Payer: Medicaid Other | Source: Ambulatory Visit | Attending: Neurology | Admitting: Neurology

## 2022-05-25 DIAGNOSIS — H4710 Unspecified papilledema: Secondary | ICD-10-CM

## 2022-05-25 DIAGNOSIS — G441 Vascular headache, not elsewhere classified: Secondary | ICD-10-CM

## 2022-05-25 DIAGNOSIS — H9191 Unspecified hearing loss, right ear: Secondary | ICD-10-CM

## 2022-05-25 DIAGNOSIS — R51 Headache with orthostatic component, not elsewhere classified: Secondary | ICD-10-CM

## 2022-05-25 DIAGNOSIS — R519 Headache, unspecified: Secondary | ICD-10-CM

## 2022-05-25 DIAGNOSIS — H543 Unqualified visual loss, both eyes: Secondary | ICD-10-CM

## 2022-05-25 MED ORDER — GADOPICLENOL 0.5 MMOL/ML IV SOLN
10.0000 mL | Freq: Once | INTRAVENOUS | Status: AC | PRN
  Administered 2022-05-25: 10 mL via INTRAVENOUS

## 2022-06-04 NOTE — Addendum Note (Signed)
Addended by: Sarina Ill B on: 06/04/2022 06:54 PM   Modules accepted: Orders

## 2022-06-05 ENCOUNTER — Ambulatory Visit: Payer: Medicaid Other | Admitting: Neurology

## 2022-06-05 ENCOUNTER — Telehealth: Payer: Self-pay | Admitting: Neurology

## 2022-06-05 NOTE — Telephone Encounter (Signed)
[employer] Rebecca [id number] exp. 05/23/2022-07/22/2022 sent to GI [phone]

## 2022-06-06 ENCOUNTER — Encounter: Payer: Self-pay | Admitting: Neurology

## 2022-07-06 ENCOUNTER — Ambulatory Visit
Admission: RE | Admit: 2022-07-06 | Discharge: 2022-07-06 | Disposition: A | Discharge status: Home / Self Care | Payer: Medicaid Other | Source: Ambulatory Visit | Attending: Neurology | Admitting: Neurology

## 2022-07-06 DIAGNOSIS — H93A9 Pulsatile tinnitus, unspecified ear: Secondary | ICD-10-CM

## 2022-07-06 MED ORDER — IOPAMIDOL (ISOVUE-370) INJECTION 76%
75.0000 mL | Freq: Once | INTRAVENOUS | Status: AC | PRN
  Administered 2022-07-06: 75 mL via INTRAVENOUS

## 2022-07-23 ENCOUNTER — Encounter: Payer: Self-pay | Admitting: Neurology

## 2022-07-23 MED ORDER — RIZATRIPTAN BENZOATE 10 MG PO TBDP: ORAL_TABLET | ORAL | 5 refills | Status: AC

## 2022-07-23 MED ORDER — KETOROLAC TROMETHAMINE 10 MG PO TABS: 10.0000 mg | ORAL_TABLET | Freq: Three times a day (TID) | ORAL | 1 refills | Status: AC | PRN

## 2022-07-23 NOTE — Telephone Encounter (Signed)
Maxalt refill sent to [facility]. I am clarifying Toradol refill with Dr Lucia Gaskins.

## 2022-07-23 NOTE — Telephone Encounter (Signed)
Toradol 10 mg tablet refill per Dr Lucia Gaskins.

## 2022-07-23 NOTE — Addendum Note (Signed)
Addended by: Bertram Savin on: 07/23/2022 10:32 AM   Modules accepted: Orders

## 2022-08-17 ENCOUNTER — Ambulatory Visit (INDEPENDENT_AMBULATORY_CARE_PROVIDER_SITE_OTHER): Payer: Medicaid Other | Admitting: Podiatry

## 2022-08-17 ENCOUNTER — Encounter: Payer: Self-pay | Admitting: Podiatry

## 2022-08-17 DIAGNOSIS — M722 Plantar fascial fibromatosis: Secondary | ICD-10-CM | POA: Diagnosis not present

## 2022-08-17 MED ORDER — TRIAMCINOLONE ACETONIDE 10 MG/ML IJ SUSP
10.0000 mg | Freq: Once | INTRAMUSCULAR | Status: AC
Start: 2022-08-17 — End: 2022-08-17
  Administered 2022-08-17: 10 mg

## 2022-08-19 NOTE — Progress Notes (Signed)
Subjective:   Patient ID: Rebecca Blackburn, female   DOB: 37 y.o.   MRN: [id number]   HPI Patient states she is doing some better but still has pain with deep palpation.  States that it is inflamed in the heel   ROS      Objective:  Physical Exam  Neurovascular status intact exquisite discomfort medial fascial band left insertional point tendon calcaneus     Assessment:  Acute [place] fasciitis left inflammation fluid around the medial band     Plan:  H&P reviewed sterile prep injected the fascia at insertion 3 mg Kenalog 5 mg Xylocaine instructed physical therapy reappoint to recheck

## 2023-01-09 ENCOUNTER — Ambulatory Visit: Payer: Medicaid Other | Attending: Physical Medicine and Rehabilitation | Admitting: Physical Therapy

## 2023-01-09 DIAGNOSIS — M6281 Muscle weakness (generalized): Secondary | ICD-10-CM | POA: Diagnosis present

## 2023-01-09 DIAGNOSIS — R2689 Other abnormalities of gait and mobility: Secondary | ICD-10-CM

## 2023-01-09 DIAGNOSIS — M5459 Other low back pain: Secondary | ICD-10-CM

## 2023-01-09 NOTE — Therapy (Signed)
OUTPATIENT PHYSICAL THERAPY THORACOLUMBAR EVALUATION   Patient Name: Rebecca Blackburn MRN: [id number] DOB:[birth year], 37 y.o., female Today's Date: 01/09/2023  END OF SESSION:  PT End of Session - 01/09/23 0806     Visit Number 1    Number of Visits 13   Plus eval   Date for PT Re-Evaluation 03/06/23    Authorization Type [place] [employer]    Progress Note Due on Visit 10    PT Start Time 0805    PT Stop Time 0845    PT Time Calculation (min) 40 min    Activity Tolerance Patient tolerated treatment well    Behavior During Therapy WFL for tasks assessed/performed             Past Medical History:  Diagnosis Date   Asthma    High blood pressure    Migraine    Migraines    Varicella    Past Surgical History:  Procedure Laterality Date   CESAREAN SECTION     2016 and 2006   LUMBAR MICRODISCECTOMY  06/24/2020   SKIN BIOPSY     TONSILLECTOMY     tooth removal Right    Patient Active Problem List   Diagnosis Date Noted   Varicose 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 04/16/2022   Chronic neck pain 10/19/2020   Myofascial pain syndrome, cervical 10/19/2020   History of 2019 novel coronavirus disease (COVID-19) 09/06/2020   Mild intermittent asthma without complication 09/06/2020   S/P lumbar microdiscectomy 07/25/2020   COVID-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 06/14/2020   Nasal turbinate hypertrophy 03/15/2020   Corn of foot 01/28/2020   Chronic nonintractable headache 01/04/2020   Deviated septum 01/04/2020   ETD (Eustachian tube dysfunction), bilateral 01/04/2020   Migraine with aura and without status migrainosus, not intractable 03/18/2019   Seborrheic dermatitis 02/13/2019   Dizziness 01/13/2019   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   ASCUS of cervix with negative high risk HPV 11/20/2018   Encounter for gynecological examination without abnormal finding 11/07/2018   Genital warts 09/30/2018   History of loop electrical excision procedure (LEEP) 09/30/2018    Arthritis of neck 01/23/2018   Essential hypertension 01/23/2018   GERD with esophagitis 01/23/2018   Migraine without aura and without status migrainosus, not intractable 01/23/2018   Morbid obesity due to excess calories (HCC) 01/23/2018    PCP: Lavonda Jumbo, PA-C  REFERRING PROVIDER: Su Monks, PA-C  REFERRING DIAG: M54.50 (ICD-10-CM) - Low back pain, unspecified M54.6 (ICD-10-CM) - Pain in thoracic spine  Rationale for Evaluation and Treatment: Rehabilitation  THERAPY DIAG:  Muscle weakness (generalized)  Other low back pain  Other abnormalities of gait and mobility  ONSET DATE: 01/01/2023 (referral)   SUBJECTIVE:  SUBJECTIVE STATEMENT: Pt, who has been seen at this clinic previously, returns to clinic for chronic R-sided low back pain. Reports she has tried needling and massages in past, needling sent her to the ER due to muscle spasms and massages do not provide long term relief. 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 States her back feels the best when she takes a break from work. Has pain that shoots around her mid back and cannot tolerate wearing a bra. Occasionally will work on stretching and mobility work but not consistently. States she does not tolerate heat well, causes her back to spasm more.    PERTINENT HISTORY:  Had a lumbar discectomy 2 years ago   PAIN:  Are you having pain? No States her back is achy  PRECAUTIONS: Fall  RED FLAGS: None   WEIGHT BEARING RESTRICTIONS: No  FALLS:  Has patient fallen in last 6 months? No  LIVING ENVIRONMENT: Lives with: lives with their daughter Lives in: House/apartment Stairs: No Has following equipment at home: None  OCCUPATION: Server at [employer]  PLOF: Independent  PATIENT  GOALS: "Getting this knot out"   NEXT MD VISIT: Was told to complete PT prior to returning to spine center   OBJECTIVE:  Note: Objective measures were completed at Evaluation unless otherwise noted.  DIAGNOSTIC FINDINGS:  X-Ray of Lumbar spine from 12/18/2022 IMPRESSION:   1. Slight retrolisthesis of L2 on L3 and L3 on L4.  2.  Dextroconvex curvature of the lumbar spine with weightbearing.  3.  Mild multilevel degenerative disc height loss.  4.  No acute fractures identified.   PATIENT SURVEYS:  Modified Oswestry to be assessed    SCREENING FOR RED FLAGS: Bowel or bladder incontinence: No Spinal tumors: No Cauda equina syndrome: No Compression fracture: No Abdominal aneurysm: No  COGNITION: Overall cognitive status: Within functional limits for tasks assessed     SENSATION: Pt denies numbness/tingling of BLEs but states she occasionally has numbness above her right latissimus dorsi m.  POSTURE: rounded shoulders, forward head, and increased lumbar lordosis  PALPATION: TTP along origin points of R lat. Dorsi m., R paraspinals and R upper trap   LUMBAR ROM: Tested in standing position   AROM eval  Flexion WNL - decreased lumbar lordosis   Extension WNL  Right lateral flexion WNL, minor pain  Left lateral flexion WNL  Right rotation Lacking 50% ROM, moderate pain  Left rotation WNL, minor pain   (Blank rows = not tested)    LOWER EXTREMITY MMT:    MMT Right eval Left eval  Hip flexion    Hip extension    Hip abduction    Hip adduction    Hip internal rotation    Hip external rotation    Knee flexion    Knee extension    Ankle dorsiflexion    Ankle plantarflexion    Ankle inversion    Ankle eversion     (Blank rows = not tested)  GAIT: Distance walked: Various clinic distances  Assistive device utilized: None Level of assistance: Complete Independence Comments: Decreased gait speed, waddle-like gait pattern w/decreased truncal rotation   TODAY'S  TREATMENT:        Next Session  PATIENT EDUCATION:  Education details: POC, eval findings, plan to hold off on aquatic therapy for now but can reassess later  Person educated: Patient Education method: Explanation Education comprehension: verbalized understanding  HOME EXERCISE PROGRAM: To be established   ASSESSMENT:  CLINICAL IMPRESSION: Patient is a 37 year old female referred to Neuro [facility] for back pain.   Pt's PMH is significant for: asthma, HTN, migraines. The following deficits were present during the exam: decreased spinal A/ROM, decreased functional strength and muscle spasms. Pt would benefit from skilled PT to address these impairments and functional limitations to maximize functional mobility independence   OBJECTIVE IMPAIRMENTS: decreased activity tolerance, decreased mobility, decreased ROM, decreased strength, increased muscle spasms, impaired flexibility, impaired UE functional use, improper body mechanics, and pain.   ACTIVITY LIMITATIONS: carrying, lifting, standing, reach over head, and locomotion level  PARTICIPATION LIMITATIONS: cleaning, laundry, community activity, and occupation  PERSONAL FACTORS: Fitness and occupation  are also affecting patient's functional outcome.   REHAB POTENTIAL: Good  CLINICAL DECISION MAKING: Stable/uncomplicated  EVALUATION COMPLEXITY: Low   GOALS: Goals reviewed with patient? Yes  SHORT TERM GOALS: Target date: 02/06/2023   Pt will be independent with initial HEP for improved strength, spinal mobility and reduced pain levels.  Baseline: not established on eval  Goal status: INITIAL  2.  Modified Oswestry to be assessed and LTG updated  Baseline:  Goal status: INITIAL  3.  Pt will demonstrate proper lifting technique of 20# object from floor and from overhead shelf for improved body mechanics at  work and reduced pain levels  Baseline:  Goal status: INITIAL   LONG TERM GOALS: Target date: 02/20/2023   Pt will be independent with final HEP for improved strength, spinal mobility and reduced pain levels. Baseline:  Goal status: INITIAL  2.  Modified Oswestry goal  Baseline:  Goal status: INITIAL  3.  Pt will improve R lumbar rotation to normal limits w/min pain for improved functional mobility and body mechanics  Baseline: lacking 50% ROM w/moderate pain  Goal status: INITIAL    PLAN:  PT FREQUENCY: 2x/week  PT DURATION: 6 weeks  PLANNED INTERVENTIONS: 97164- PT Re-evaluation, 97110-Therapeutic exercises, 97530- Therapeutic activity, 97112- Neuromuscular re-education, 97535- Self Care, 13244- Manual therapy, [id number]- Gait training, [phone]- Aquatic Therapy, [phone]- Electrical stimulation (manual), Balance training, Taping, Dry Needling, Joint mobilization, Joint manipulation, Spinal manipulation, and Spinal mobilization.  PLAN FOR NEXT SESSION: Modified Oswestry and update LTG. Establish HEP for improved spinal mobility and periscapular strength   Check all possible CPT codes: See Planned Interventions List for Planned CPT Codes    Check all conditions that are expected to impact treatment: Contractures, spasticity or fracture relevant to requested treatment   If treatment provided at initial evaluation, no treatment charged due to lack of authorization.       Jeniah Kishi E Teleshia Lemere, PT 01/09/2023, 8:48 AM

## 2023-01-17 ENCOUNTER — Ambulatory Visit: Payer: Medicaid Other | Attending: Physician Assistant | Admitting: Physical Therapy

## 2023-01-17 VITALS — BP 125/87 | HR 69

## 2023-01-17 DIAGNOSIS — M6281 Muscle weakness (generalized): Secondary | ICD-10-CM | POA: Diagnosis present

## 2023-01-17 DIAGNOSIS — R2689 Other abnormalities of gait and mobility: Secondary | ICD-10-CM | POA: Insufficient documentation

## 2023-01-17 DIAGNOSIS — M5459 Other low back pain: Secondary | ICD-10-CM | POA: Insufficient documentation

## 2023-01-17 NOTE — Therapy (Signed)
OUTPATIENT PHYSICAL THERAPY THORACOLUMBAR TREATMENT   Patient Name: Rebecca Blackburn MRN: [id number] DOB:12/29/1985, 37 y.o., female Today's Date: 01/17/2023  END OF SESSION:  PT End of Session - 01/17/23 0806     Visit Number 2    Number of Visits 13   Plus eval   Date for PT Re-Evaluation 03/06/23    Authorization Type [place] Medicaid [employer]    Progress Note Due on Visit 10    PT Start Time 0806   Pt in restroom   PT Stop Time 0846    PT Time Calculation (min) 40 min    Activity Tolerance Patient tolerated treatment well    Behavior During Therapy [facility] for tasks assessed/performed              Past Medical History:  Diagnosis Date   Asthma    High blood pressure    Migraine    Migraines    Varicella    Past Surgical History:  Procedure Laterality Date   CESAREAN SECTION     2016 and 2006   LUMBAR MICRODISCECTOMY  06/24/2020   SKIN BIOPSY     TONSILLECTOMY     tooth removal Right    Patient Active Problem List   Diagnosis Date Noted   Varicose 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 04/16/2022   Chronic neck pain 10/19/2020   Myofascial pain syndrome, cervical 10/19/2020   History of 2019 novel coronavirus disease (COVID-19) 09/06/2020   Mild intermittent asthma without complication 09/06/2020   S/P lumbar microdiscectomy 07/25/2020   COVID-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 06/14/2020   Nasal turbinate hypertrophy 03/15/2020   Corn of foot 01/28/2020   Chronic nonintractable headache 01/04/2020   Deviated septum 01/04/2020   ETD (Eustachian tube dysfunction), bilateral 01/04/2020   Migraine with aura and without status migrainosus, not intractable 03/18/2019   Seborrheic dermatitis 02/13/2019   Dizziness 01/13/2019   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   ASCUS of cervix with negative high risk HPV 11/20/2018   Encounter for gynecological examination without abnormal finding 11/07/2018   Genital warts 09/30/2018   History of loop electrical excision procedure  (LEEP) 09/30/2018   Arthritis of neck 01/23/2018   Essential hypertension 01/23/2018   GERD with esophagitis 01/23/2018   Migraine without aura and without status migrainosus, not intractable 01/23/2018   Morbid obesity due to excess calories (HCC) 01/23/2018    PCP: Lavonda Jumbo, PA-C  REFERRING PROVIDER: Su Monks, PA-C  REFERRING DIAG: M54.50 (ICD-10-CM) - Low back pain, unspecified M54.6 (ICD-10-CM) - Pain in thoracic spine  Rationale for Evaluation and Treatment: Rehabilitation  THERAPY DIAG:  Other low back pain  Other abnormalities of gait and mobility  Muscle weakness (generalized)  ONSET DATE: 01/01/2023 (referral)   SUBJECTIVE:  SUBJECTIVE STATEMENT: Pt reports doing okay. Rating her low back stiffness/discomfort as a 5/10 today. Was given a new prescription for HTN, has not picked it up yet.   PERTINENT HISTORY:  Had a lumbar discectomy 2 years ago   PAIN:  Are you having pain? No States her back is achy  PRECAUTIONS: Fall  RED FLAGS: None   WEIGHT BEARING RESTRICTIONS: No  FALLS:  Has patient fallen in last 6 months? 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  DIAGNOSTIC FINDINGS:  X-Ray of Lumbar spine from 12/18/2022 IMPRESSION:   1. Slight retrolisthesis of L2 on L3 and L3 on L4.  2.  Dextroconvex curvature of the lumbar spine with weightbearing.  3.  Mild multilevel degenerative disc height  loss.  4.  No acute fractures identified.   PATIENT SURVEYS:  Modified Oswestry 14/50 (11/7)   SCREENING FOR RED FLAGS: Bowel or bladder incontinence: No Spinal tumors: No Cauda equina syndrome: No Compression fracture: No Abdominal aneurysm: No  COGNITION: Overall cognitive status: Within functional limits for tasks assessed     SENSATION: Pt denies numbness/tingling of BLEs but states she occasionally has numbness above her right latissimus dorsi m.  POSTURE: rounded shoulders, forward head, and increased lumbar lordosis  PALPATION: TTP along origin points of R lat. Dorsi m., R paraspinals and R upper trap   LUMBAR ROM: Tested in standing position   AROM eval  Flexion WNL - decreased lumbar lordosis   Extension WNL  Right lateral flexion WNL, minor pain  Left lateral flexion WNL  Right rotation Lacking 50% ROM, moderate pain  Left rotation WNL, minor pain   (Blank rows = not tested)   GAIT: Distance walked: Various clinic distances  Assistive device utilized: None Level of assistance: Complete Independence Comments: Decreased gait speed, waddle-like gait pattern w/decreased truncal rotation   VITALS Vitals:   01/17/23 0810  BP: 125/87  Pulse: 69     TODAY'S TREATMENT:        Ther Act  Assessed vitals (see above) and WNL for therapy  Modified Oswestry: 14/50  Ther Ex  The following exercises were performed for improved spinal mobility, posterior chain strength and pain modulation. Used to establish HEP (see bolded below):  Supine LTRs, x10 per side  PPT, x10 reps w/3-5s hold Bridge w/ppt, x10 reps  Sidelying open books, x12 per side. Pt reported feeling tightness in lats w/movement  Supine iron crosses, x6 per side. Decreased mobility noted on LLE > RLE Quadruped cat cows, x10 reps  Quadruped thread the needles, x1 rep per side. Pt unable to tolerate w/RUE so discontinued for today  PATIENT EDUCATION:  Education details: initial HEP Person educated: Patient Education method: Explanation, Demonstration, and Handouts Education comprehension: verbalized understanding and returned demonstration  HOME EXERCISE PROGRAM: Access Code: [email or web address] URL: [email or web address] Date: 01/17/2023 Prepared by: Alethia Berthold Shavon Zenz  Exercises - Lower Trunk Rotation Stretch  - 1 x daily - 7 x weekly - 3 sets - 10 reps - Posterior pelvic tilt  - 1 x daily - 7 x weekly - 3 sets - 10 reps - Supine Bridge with Pelvic Floor Contraction  - 1 x daily - 7 x weekly - 3 sets - 10 reps - Sidelying Thoracic Rotation with Open Book  - 1 x daily - 7 x weekly - 3 sets - 10 reps - Supine Straight Leg Lumbar Rotation Stretch  - 1 x daily - 7 x weekly - 3 sets - 10 reps - Cat Cow  - 1 x daily - 7 x weekly - 3 sets - 10 reps  ASSESSMENT:  CLINICAL IMPRESSION: Emphasis of skilled PT session on improved spinal mobility, core stability, posterior chain strength and pain modulation. Pt continues to report high levels of pain along R lats w/exercise but did tolerate session well. Pt reports feeling tightness in coccyx w/exercise as well, so will address pelvic mobility next session. Continue POC.    OBJECTIVE IMPAIRMENTS: decreased activity tolerance, decreased mobility, decreased ROM, decreased strength, increased muscle spasms, impaired flexibility, impaired UE functional use, improper body mechanics, and pain.   ACTIVITY LIMITATIONS: carrying, lifting, standing, reach over head, and locomotion level  PARTICIPATION LIMITATIONS: cleaning, laundry, community activity, and occupation  PERSONAL FACTORS: Fitness and occupation  are also affecting patient's functional outcome.   REHAB POTENTIAL: Good  CLINICAL DECISION MAKING: Stable/uncomplicated  EVALUATION COMPLEXITY: Low   GOALS: Goals reviewed with patient? Yes  SHORT TERM GOALS: Target  date: 02/06/2023   Pt will be independent with initial HEP for improved strength, spinal mobility and reduced pain levels.  Baseline: not established on eval  Goal status: INITIAL  2.  Modified Oswestry to be assessed and LTG updated  Baseline: 14/50 Goal status: MET  3.  Pt will demonstrate proper lifting technique of 20# object from floor and from overhead shelf for improved body mechanics at work and reduced pain levels  Baseline:  Goal status: INITIAL   LONG TERM GOALS: Target date: 02/20/2023   Pt will be independent with final HEP for improved strength, spinal mobility and reduced pain levels. Baseline:  Goal status: INITIAL  2.  Pt will improve score on Modified Oswestry to </= 8/50 for reduced pain levels and improved quality of life   Baseline: 14/50 Goal status: REVISED  3.  Pt will improve R lumbar rotation to normal limits w/min pain for improved functional mobility and body mechanics  Baseline: lacking 50% ROM w/moderate pain  Goal status: INITIAL    PLAN:  PT FREQUENCY: 2x/week  PT DURATION: 6 weeks  PLANNED INTERVENTIONS: 97164- PT Re-evaluation, 97110-Therapeutic exercises, 97530- Therapeutic activity, 97112- Neuromuscular re-education, 97535- Self Care, 82956- Manual therapy, [id number]- Gait training, [phone]- Aquatic Therapy, [phone]- Electrical stimulation (manual), Balance training, Taping, Dry Needling, Joint mobilization, Joint manipulation, Spinal manipulation, and Spinal mobilization.  PLAN FOR NEXT SESSION: MET. Lat rollout. Rotation and OH work. Add to HEP for improved spinal mobility and periscapular strength   Check all possible CPT codes: See Planned Interventions List for Planned CPT Codes    Check all conditions that are expected to impact treatment: Contractures, spasticity or fracture relevant to requested treatment  If treatment provided at initial evaluation, no treatment charged due to lack of authorization.       Jill Alexanders Sunnie Odden, PT,  DPT 01/17/2023, 8:47 AM

## 2023-01-18 ENCOUNTER — Ambulatory Visit: Payer: Medicaid Other | Admitting: Physical Therapy

## 2023-01-18 DIAGNOSIS — M5459 Other low back pain: Secondary | ICD-10-CM | POA: Diagnosis not present

## 2023-01-18 DIAGNOSIS — M6281 Muscle weakness (generalized): Secondary | ICD-10-CM

## 2023-01-18 DIAGNOSIS — R2689 Other abnormalities of gait and mobility: Secondary | ICD-10-CM

## 2023-01-18 NOTE — Therapy (Signed)
OUTPATIENT PHYSICAL THERAPY THORACOLUMBAR TREATMENT   Patient Name: Rebecca Blackburn MRN: [id number] DOB:[birth year], 37 y.o., female Today's Date: 01/18/2023  END OF SESSION:  PT End of Session - 01/18/23 0804     Visit Number 3    Number of Visits 13   Plus eval   Date for PT Re-Evaluation 03/06/23    Authorization Type [place] [employer]    Progress Note Due on Visit 10    PT Start Time 0801    PT Stop Time 0842    PT Time Calculation (min) 41 min    Activity Tolerance Patient tolerated treatment well    Behavior During Therapy [facility] for tasks assessed/performed               Past Medical History:  Diagnosis Date   Asthma    High blood pressure    Migraine    Migraines    Varicella    Past Surgical History:  Procedure Laterality Date   CESAREAN SECTION     2016 and 2006   LUMBAR MICRODISCECTOMY  06/24/2020   SKIN BIOPSY     TONSILLECTOMY     tooth removal Right    Patient Active Problem List   Diagnosis Date Noted   Varicose 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 04/16/2022   Chronic neck pain 10/19/2020   Myofascial pain syndrome, cervical 10/19/2020   History of 2019 novel coronavirus disease (COVID-19) 09/06/2020   Mild intermittent asthma without complication 09/06/2020   S/P lumbar microdiscectomy 07/25/2020   COVID-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 06/14/2020   Nasal turbinate hypertrophy 03/15/2020   Corn of foot 01/28/2020   Chronic nonintractable headache 01/04/2020   Deviated septum 01/04/2020   ETD (Eustachian tube dysfunction), bilateral 01/04/2020   Migraine with aura and without status migrainosus, not intractable 03/18/2019   Seborrheic dermatitis 02/13/2019   Dizziness 01/13/2019   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   ASCUS of cervix with negative high risk HPV 11/20/2018   Encounter for gynecological examination without abnormal finding 11/07/2018   Genital warts 09/30/2018   History of loop electrical excision procedure (LEEP) 09/30/2018    Arthritis of neck 01/23/2018   Essential hypertension 01/23/2018   GERD with esophagitis 01/23/2018   Migraine without aura and without status migrainosus, not intractable 01/23/2018   Morbid obesity due to excess calories (HCC) 01/23/2018    PCP: Lavonda Jumbo, PA-C  REFERRING PROVIDER: Su Monks, PA-C  REFERRING DIAG: M54.50 (ICD-10-CM) - Low back pain, unspecified M54.6 (ICD-10-CM) - Pain in thoracic spine  Rationale for Evaluation and Treatment: Rehabilitation  THERAPY DIAG:  Other low back pain  Other abnormalities of gait and mobility  Muscle weakness (generalized)  ONSET DATE: 01/01/2023 (referral)   SUBJECTIVE:  SUBJECTIVE STATEMENT: Pt reports doing okay. Rating her low back stiffness/discomfort as a 5/10 today. Worked a 7 hour shift yesterday and was sore but no pain.   PERTINENT HISTORY:  Had a lumbar discectomy 2 years ago   PAIN:  Are you having pain? No States her back is achy  PRECAUTIONS: Fall  RED FLAGS: None   WEIGHT BEARING RESTRICTIONS: No  FALLS:  Has patient fallen in last 6 months? 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  DIAGNOSTIC FINDINGS:  X-Ray of Lumbar spine from 12/18/2022 IMPRESSION:   1. Slight retrolisthesis of L2 on L3 and L3 on L4.  2.  Dextroconvex curvature of the lumbar spine with weightbearing.  3.  Mild multilevel degenerative disc height loss.  4.  No acute  fractures identified.   PATIENT SURVEYS:  Modified Oswestry 14/50 (11/7)   SCREENING FOR RED FLAGS: Bowel or bladder incontinence: No Spinal tumors: No Cauda equina syndrome: No Compression fracture: No Abdominal aneurysm: No  COGNITION: Overall cognitive status: Within functional limits for tasks assessed     SENSATION: Pt denies numbness/tingling of BLEs but states she occasionally has numbness above her right latissimus dorsi m.  POSTURE: rounded shoulders, forward head, and increased lumbar lordosis  PALPATION: TTP along origin points of R lat. Dorsi m., R paraspinals and R upper trap   LUMBAR ROM: Tested in standing position   AROM eval  Flexion WNL - decreased lumbar lordosis   Extension WNL  Right lateral flexion WNL, minor pain  Left lateral flexion WNL  Right rotation Lacking 50% ROM, moderate pain  Left rotation WNL, minor pain   (Blank rows = not tested)   GAIT: Distance walked: Various clinic distances  Assistive device utilized: None Level of assistance: Complete Independence Comments: Decreased gait speed, waddle-like gait pattern w/decreased truncal rotation   VITALS There were no vitals filed for this visit.   TODAY'S TREATMENT:         Ther Ex  SciFit multi-peaks level 6 for 8 minutes using BUE/BLEs for neural priming for reciprocal movement, dynamic cardiovascular warmup and endurance.  Single arm bent over rows w/15# KB, 2x15 per side, for improved thoracic rotation and latissimus dosi m. Stretch. Pt reported feeling good stretch along L lats and popping in her shoulder w/activity.  PVC pass throughs, x20 reps, for improved shoulder mobility. Pt initially shrugging her L shoulder to perform but demonstrates symmetrical shoulder positioning by end of movement.  Standing 90-90 pec stretch in doorway w/LUE only, x3 minutes. Added to HEP (see bolded below)  Supine DB pullover w/3# DB, x20 reps, for improved lat mobility and strength.  Prayer stretch  from floor using mat table, x5 minutes, for improved lat mobility. No pain reported w/movement  PATIENT EDUCATION:  Education details: Additions to HEP Person educated: Patient Education method: Explanation, Demonstration, and Handouts Education comprehension: verbalized understanding and returned demonstration  HOME EXERCISE PROGRAM: Access Code: [email or web address] URL: [email or web address] Date: 01/17/2023 Prepared by: Alethia Berthold Francyne Arreaga  Exercises - Lower Trunk Rotation Stretch  - 1 x daily - 7 x weekly - 3 sets - 10 reps - Posterior pelvic tilt  - 1 x daily - 7 x weekly - 3 sets - 10 reps - Supine Bridge with Pelvic Floor Contraction  - 1 x daily - 7 x weekly - 3 sets - 10 reps - Sidelying Thoracic Rotation with Open Book  - 1 x daily - 7 x weekly - 3 sets - 10 reps - Supine Straight Leg Lumbar Rotation Stretch  - 1 x daily - 7 x weekly - 3 sets - 10 reps - Cat Cow  - 1 x daily - 7 x weekly - 3 sets - 10 reps - Single Arm Doorway Pec Stretch at 90 Degrees Abduction  - 1 x daily - 7 x weekly - 3 sets - 45-60 seconds hold  ASSESSMENT:  CLINICAL IMPRESSION: Emphasis of skilled PT session on improved shoulder and thoracic spine mobility as well as improved latissimus dorsi strength. Pt tolerated session well w/no report of pain. Pt reported feeling good stretch w/dumbbell pullovers and PVC pass throughs, so will continue to incorporate these into therapy session. Continue POC.    OBJECTIVE IMPAIRMENTS: decreased activity tolerance, decreased mobility, decreased ROM, decreased strength, increased muscle spasms, impaired flexibility, impaired UE functional use, improper body mechanics, and pain.   ACTIVITY LIMITATIONS: carrying, lifting, standing, reach over head, and locomotion level  PARTICIPATION LIMITATIONS: cleaning, laundry, community activity, and  occupation  PERSONAL FACTORS: Fitness and occupation  are also affecting patient's functional outcome.   REHAB POTENTIAL: Good  CLINICAL DECISION MAKING: Stable/uncomplicated  EVALUATION COMPLEXITY: Low   GOALS: Goals reviewed with patient? Yes  SHORT TERM GOALS: Target date: 02/06/2023   Pt will be independent with initial HEP for improved strength, spinal mobility and reduced pain levels.  Baseline: not established on eval  Goal status: INITIAL  2.  Modified Oswestry to be assessed and LTG updated  Baseline: 14/50 Goal status: MET  3.  Pt will demonstrate proper lifting technique of 20# object from floor and from overhead shelf for improved body mechanics at work and reduced pain levels  Baseline:  Goal status: INITIAL   LONG TERM GOALS: Target date: 02/20/2023   Pt will be independent with final HEP for improved strength, spinal mobility and reduced pain levels. Baseline:  Goal status: INITIAL  2.  Pt will improve score on Modified Oswestry to </= 8/50 for reduced pain levels and improved quality of life   Baseline: 14/50 Goal status: REVISED  3.  Pt will improve R lumbar rotation to normal limits w/min pain for improved functional mobility and body mechanics  Baseline: lacking 50% ROM w/moderate pain  Goal status: INITIAL    PLAN:  PT FREQUENCY: 2x/week  PT DURATION: 6 weeks  PLANNED INTERVENTIONS: 97164- PT Re-evaluation, 97110-Therapeutic exercises, 97530- Therapeutic activity, 97112- Neuromuscular re-education, 97535- Self Care, 54098- Manual therapy, [id number]- Gait training, [phone]- Aquatic Therapy, [phone]- Electrical stimulation (manual), Balance training, Taping, Dry Needling, Joint mobilization, Joint manipulation, Spinal manipulation, and Spinal mobilization.  PLAN FOR NEXT SESSION: MET. Lat rollout. Rotation and OH work. Add to HEP for improved spinal mobility and periscapular strength. Lifting technique   Check all possible CPT codes: See Planned  Interventions  List for Planned CPT Codes    Check all conditions that are expected to impact treatment: Contractures, spasticity or fracture relevant to requested treatment   If treatment provided at initial evaluation, no treatment charged due to lack of authorization.       Jill Alexanders Zeriah Baysinger, PT, DPT 01/18/2023, 8:43 AM

## 2023-01-22 ENCOUNTER — Ambulatory Visit: Payer: Medicaid Other | Admitting: Physical Therapy

## 2023-01-22 DIAGNOSIS — M5459 Other low back pain: Secondary | ICD-10-CM | POA: Diagnosis not present

## 2023-01-22 DIAGNOSIS — R2689 Other abnormalities of gait and mobility: Secondary | ICD-10-CM

## 2023-01-22 DIAGNOSIS — M6281 Muscle weakness (generalized): Secondary | ICD-10-CM

## 2023-01-22 NOTE — Therapy (Signed)
OUTPATIENT PHYSICAL THERAPY THORACOLUMBAR TREATMENT   Patient Name: Rebecca Blackburn MRN: [id number] DOB:[birth year], 37 y.o., female Today's Date: 01/22/2023  END OF SESSION:  PT End of Session - 01/22/23 0816     Visit Number 4    Number of Visits 13   Plus eval   Date for PT Re-Evaluation 03/06/23    Authorization Type [place] [employer]    Progress Note Due on Visit 10    PT Start Time [id number]   Pt arrived late   PT Stop Time 0845    PT Time Calculation (min) 31 min    Activity Tolerance Patient tolerated treatment well    Behavior During Therapy WFL for tasks assessed/performed               Past Medical History:  Diagnosis Date   Asthma    High blood pressure    Migraine    Migraines    Varicella    Past Surgical History:  Procedure Laterality Date   CESAREAN SECTION     2016 and 2006   LUMBAR MICRODISCECTOMY  06/24/2020   SKIN BIOPSY     TONSILLECTOMY     tooth removal Right    Patient Active Problem List   Diagnosis Date Noted   Varicose 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 04/16/2022   Chronic neck pain 10/19/2020   Myofascial pain syndrome, cervical 10/19/2020   History of 2019 novel coronavirus disease (COVID-19) 09/06/2020   Mild intermittent asthma without complication 09/06/2020   S/P lumbar microdiscectomy 07/25/2020   COVID-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 06/14/2020   Nasal turbinate hypertrophy 03/15/2020   Corn of foot 01/28/2020   Chronic nonintractable headache 01/04/2020   Deviated septum 01/04/2020   ETD (Eustachian tube dysfunction), bilateral 01/04/2020   Migraine with aura and without status migrainosus, not intractable 03/18/2019   Seborrheic dermatitis 02/13/2019   Dizziness 01/13/2019   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   ASCUS of cervix with negative high risk HPV 11/20/2018   Encounter for gynecological examination without abnormal finding 11/07/2018   Genital warts 09/30/2018   History of loop electrical excision procedure  (LEEP) 09/30/2018   Arthritis of neck 01/23/2018   Essential hypertension 01/23/2018   GERD with esophagitis 01/23/2018   Migraine without aura and without status migrainosus, not intractable 01/23/2018   Morbid obesity due to excess calories (HCC) 01/23/2018    PCP: Lavonda Jumbo, PA-C  REFERRING PROVIDER: Su Monks, PA-C  REFERRING DIAG: M54.50 (ICD-10-CM) - Low back pain, unspecified M54.6 (ICD-10-CM) - Pain in thoracic spine  Rationale for Evaluation and Treatment: Rehabilitation  THERAPY DIAG:  Other low back pain  Other abnormalities of gait and mobility  Muscle weakness (generalized)  ONSET DATE: 01/01/2023 (referral)   SUBJECTIVE:  SUBJECTIVE STATEMENT: Pt reports she was very sore following last session and is sore today. Worked a long shift yesterday. Exercises are helping some. Rating her soreness as a 5-6/10   PERTINENT HISTORY:  Had a lumbar discectomy 2 years ago   PAIN:  Are you having pain? No States her back is achy  PRECAUTIONS: Fall  RED FLAGS: None   WEIGHT BEARING RESTRICTIONS: No  FALLS:  Has patient fallen in last 6 months? 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  DIAGNOSTIC FINDINGS:  X-Ray of Lumbar spine from 12/18/2022 IMPRESSION:   1. Slight retrolisthesis of L2 on L3 and L3 on L4.  2.  Dextroconvex curvature of the lumbar spine with weightbearing.  3.  Mild multilevel  degenerative disc height loss.  4.  No acute fractures identified.   PATIENT SURVEYS:  Modified Oswestry 14/50 (11/7)   SCREENING FOR RED FLAGS: Bowel or bladder incontinence: No Spinal tumors: No Cauda equina syndrome: No Compression fracture: No Abdominal aneurysm: No  COGNITION: Overall cognitive status: Within functional limits for tasks assessed     SENSATION: Pt denies numbness/tingling of BLEs but states she occasionally has numbness above her right latissimus dorsi m.  POSTURE: rounded shoulders, forward head, and increased lumbar lordosis  PALPATION: TTP along origin points of R lat. Dorsi m., R paraspinals and R upper trap   LUMBAR ROM: Tested in standing position   AROM eval  Flexion WNL - decreased lumbar lordosis   Extension WNL  Right lateral flexion WNL, minor pain  Left lateral flexion WNL  Right rotation Lacking 50% ROM, moderate pain  Left rotation WNL, minor pain   (Blank rows = not tested)   GAIT: Distance walked: Various clinic distances  Assistive device utilized: None Level of assistance: Complete Independence Comments: Decreased gait speed, waddle-like gait pattern w/decreased truncal rotation   VITALS There were no vitals filed for this visit.   TODAY'S TREATMENT:         Ther Ex  SciFit multi-peaks level 8 for 8 minutes using BUE/BLEs for neural priming for reciprocal movement, dynamic cardiovascular warmup and endurance.  Supine MET to SIJ, 5x10s holds per side, for improved pelvic alignment and low back pain modulation. Pt denied pain w/movement.  Seated pallof presses w/orange theraband, x14 per side, for improved functional core stability. Pt reported feeling a pull in her low back but denied pain. Added to HEP (see bolded below).  Verbally reviewed prayer stretch from previous session and added to HEP (see bolded below).  PATIENT EDUCATION:  Education details: Additions to HEP, importance of performing gentle mobility after work  Person educated: Patient Education method: Programmer, multimedia, Facilities manager, and Handouts Education comprehension: verbalized understanding and returned demonstration  HOME EXERCISE PROGRAM: Access Code: [email or web address] URL: [email or web address] Date: 01/17/2023 Prepared by: Alethia Berthold Cali Hope  Exercises - Lower Trunk Rotation Stretch  - 1 x daily - 7 x weekly - 3 sets - 10 reps - Posterior pelvic tilt  - 1 x daily - 7 x weekly - 3 sets - 10 reps - Supine Bridge with Pelvic Floor Contraction  - 1 x daily - 7 x weekly - 3 sets - 10 reps - Sidelying Thoracic Rotation with Open Book  - 1 x daily - 7 x weekly - 3 sets - 10 reps - Supine Straight Leg Lumbar Rotation Stretch  - 1 x daily - 7 x weekly - 3 sets - 10 reps - Cat Cow  - 1 x daily - 7 x weekly - 3 sets - 10 reps - Single Arm Doorway Pec Stretch at 90 Degrees Abduction  - 1 x daily - 7 x weekly - 3 sets - 45-60 seconds hold - Prone Chest Stretch on Chair  - 1 x daily - 7 x weekly - 3 sets - 30-45 second hold - Standing Anti-Rotation Press with Anchored Resistance  - 1 x daily - 7 x weekly - 3 sets - 10 reps  ASSESSMENT:  CLINICAL IMPRESSION: Session limited due to pt's late arrival. Emphasis of skilled PT session on improved functional core stability and pelvic stability. Pt continues to report high levels of soreness following session and work, but no pain. Pt will continue to benefit from skilled PT to improve core stability and functional mobility for reduced pain levels. Continue POC.    OBJECTIVE IMPAIRMENTS: decreased activity tolerance, decreased mobility, decreased ROM, decreased strength, increased muscle spasms, impaired flexibility, impaired UE functional use, improper body mechanics, and pain.   ACTIVITY LIMITATIONS: carrying, lifting, standing, reach over head, and locomotion level  PARTICIPATION  LIMITATIONS: cleaning, laundry, community activity, and occupation  PERSONAL FACTORS: Fitness and occupation  are also affecting patient's functional outcome.   REHAB POTENTIAL: Good  CLINICAL DECISION MAKING: Stable/uncomplicated  EVALUATION COMPLEXITY: Low   GOALS: Goals reviewed with patient? Yes  SHORT TERM GOALS: Target date: 02/06/2023   Pt will be independent with initial HEP for improved strength, spinal mobility and reduced pain levels.  Baseline: not established on eval  Goal status: INITIAL  2.  Modified Oswestry to be assessed and LTG updated  Baseline: 14/50 Goal status: MET  3.  Pt will demonstrate proper lifting technique of 20# object from floor and from overhead shelf for improved body mechanics at work and reduced pain levels  Baseline:  Goal status: INITIAL   LONG TERM GOALS: Target date: 02/20/2023   Pt will be independent with final HEP for improved strength, spinal mobility and reduced pain levels. Baseline:  Goal status: INITIAL  2.  Pt will improve score on Modified Oswestry to </= 8/50 for reduced pain levels and improved quality of life   Baseline: 14/50 Goal status: REVISED  3.  Pt will improve R lumbar rotation to normal limits w/min pain for improved functional mobility and body mechanics  Baseline: lacking 50% ROM w/moderate pain  Goal status: INITIAL    PLAN:  PT FREQUENCY: 2x/week  PT DURATION: 6 weeks  PLANNED INTERVENTIONS: 97164- PT Re-evaluation, 97110-Therapeutic exercises, 97530- Therapeutic activity, 97112- Neuromuscular re-education, 97535- Self Care, 54098- Manual therapy,  09811- Gait training, 91478- Aquatic Therapy, [phone]- Electrical stimulation (manual), Balance training, Taping, Dry Needling, Joint mobilization, Joint manipulation, Spinal manipulation, and Spinal mobilization.  PLAN FOR NEXT SESSION: Core stability. Lat rollout. Rotation and OH work. Add to HEP for improved spinal mobility and periscapular strength.  Lifting technique   Check all possible CPT codes: See Planned Interventions List for Planned CPT Codes    Check all conditions that are expected to impact treatment: Contractures, spasticity or fracture relevant to requested treatment   If treatment provided at initial evaluation, no treatment charged due to lack of authorization.       Jill Alexanders Yassmine Tamm, PT, DPT 01/22/2023, 8:45 AM

## 2023-01-24 ENCOUNTER — Ambulatory Visit: Payer: Medicaid Other | Admitting: Physical Therapy

## 2023-01-24 DIAGNOSIS — M5459 Other low back pain: Secondary | ICD-10-CM | POA: Diagnosis not present

## 2023-01-24 DIAGNOSIS — M6281 Muscle weakness (generalized): Secondary | ICD-10-CM

## 2023-01-24 DIAGNOSIS — R2689 Other abnormalities of gait and mobility: Secondary | ICD-10-CM

## 2023-01-24 NOTE — Therapy (Signed)
OUTPATIENT PHYSICAL THERAPY THORACOLUMBAR TREATMENT   Patient Name: Rebecca Blackburn MRN: [id number] DOB:1985-04-19, 37 y.o., female Today's Date: 01/24/2023  END OF SESSION:  PT End of Session - 01/24/23 0810     Visit Number 5    Number of Visits 13   Plus eval   Date for PT Re-Evaluation 03/06/23    Authorization Type [place] Medicaid [employer]    Progress Note Due on Visit 10    PT Start Time 0808   Pt arrived late   PT Stop Time 0845    PT Time Calculation (min) 37 min    Activity Tolerance Patient tolerated treatment well    Behavior During Therapy WFL for tasks assessed/performed                Past Medical History:  Diagnosis Date   Asthma    High blood pressure    Migraine    Migraines    Varicella    Past Surgical History:  Procedure Laterality Date   CESAREAN SECTION     2016 and 2006   LUMBAR MICRODISCECTOMY  06/24/2020   SKIN BIOPSY     TONSILLECTOMY     tooth removal Right    Patient Active Problem List   Diagnosis Date Noted   Varicose 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 04/16/2022   Chronic neck pain 10/19/2020   Myofascial pain syndrome, cervical 10/19/2020   History of 2019 novel coronavirus disease (COVID-19) 09/06/2020   Mild intermittent asthma without complication 09/06/2020   S/P lumbar microdiscectomy 07/25/2020   COVID-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 06/14/2020   Nasal turbinate hypertrophy 03/15/2020   Corn of foot 01/28/2020   Chronic nonintractable headache 01/04/2020   Deviated septum 01/04/2020   ETD (Eustachian tube dysfunction), bilateral 01/04/2020   Migraine with aura and without status migrainosus, not intractable 03/18/2019   Seborrheic dermatitis 02/13/2019   Dizziness 01/13/2019   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   ASCUS of cervix with negative high risk HPV 11/20/2018   Encounter for gynecological examination without abnormal finding 11/07/2018   Genital warts 09/30/2018   History of loop electrical excision  procedure (LEEP) 09/30/2018   Arthritis of neck 01/23/2018   Essential hypertension 01/23/2018   GERD with esophagitis 01/23/2018   Migraine without aura and without status migrainosus, not intractable 01/23/2018   Morbid obesity due to excess calories (HCC) 01/23/2018    PCP: Lavonda Jumbo, PA-C  REFERRING PROVIDER: Su Monks, PA-C  REFERRING DIAG: M54.50 (ICD-10-CM) - Low back pain, unspecified M54.6 (ICD-10-CM) - Pain in thoracic spine  Rationale for Evaluation and Treatment: Rehabilitation  THERAPY DIAG:  Other low back pain  Muscle weakness (generalized)  Other abnormalities of gait and mobility  ONSET DATE: 01/01/2023 (referral)   SUBJECTIVE:  SUBJECTIVE STATEMENT: Pt reports feeling pretty good. Got a massage and is feeling better. Rating her back soreness as a 3/10   PERTINENT HISTORY:  Had a lumbar discectomy 2 years ago   PAIN:  Are you having pain? No States her back is achy  PRECAUTIONS: Fall  RED FLAGS: None   WEIGHT BEARING RESTRICTIONS: No  FALLS:  Has patient fallen in last 6 months? 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  DIAGNOSTIC FINDINGS:  X-Ray of Lumbar spine from 12/18/2022 IMPRESSION:   1. Slight retrolisthesis of L2 on L3 and L3 on L4.  2.  Dextroconvex curvature of the lumbar spine with weightbearing.  3.  Mild multilevel degenerative disc height loss.  4.  No acute fractures  identified.   PATIENT SURVEYS:  Modified Oswestry 14/50 (11/7)   SCREENING FOR RED FLAGS: Bowel or bladder incontinence: No Spinal tumors: No Cauda equina syndrome: No Compression fracture: No Abdominal aneurysm: No  COGNITION: Overall cognitive status: Within functional limits for tasks assessed     SENSATION: Pt denies numbness/tingling of BLEs but states she occasionally has numbness above her right latissimus dorsi m.  POSTURE: rounded shoulders, forward head, and increased lumbar lordosis  PALPATION: TTP along origin points of R lat. Dorsi m., R paraspinals and R upper trap   LUMBAR ROM: Tested in standing position   AROM eval  Flexion WNL - decreased lumbar lordosis   Extension WNL  Right lateral flexion WNL, minor pain  Left lateral flexion WNL  Right rotation Lacking 50% ROM, moderate pain  Left rotation WNL, minor pain   (Blank rows = not tested)   GAIT: Distance walked: Various clinic distances  Assistive device utilized: None Level of assistance: Complete Independence Comments: Decreased gait speed, waddle-like gait pattern w/decreased truncal rotation   VITALS There were no vitals filed for this visit.   TODAY'S TREATMENT:         Ther Ex  SciFit multi-peaks level 9 for 6 minutes using BUE/BLEs for neural priming for reciprocal movement, dynamic cardiovascular warmup and endurance.  Glute step ups to 6" box holding 15# KB in contralateral hand, x18 reps per side, for improved functional hip strength and core stability. Noted decreased eccentric control bilaterally, RLE >LLE  Using clock yourself app on simple mode, lunges to time on clock holding 15# KB in goblet position. Pt very challenged to correctly step to clock numbers, so added 4 colored dots to represent 12, 3, 6 and 9 o'clock to assist w/proper foot placement and pt performed x2 minutes w/SBA for improved functional BLE strength and core stability.  Bird dogs, x8 per side, for improved  posterior chain strength and core stability. Increased difficulty stabilizing on RLE >LLE  PVC goodmornings, x15 reps, for improved functional mobility and cool down.  PATIENT EDUCATION:  Education details: Continue HEP Person educated: Patient Education method: Explanation Education comprehension: verbalized understanding  HOME EXERCISE PROGRAM: Access Code: [email or web address] URL: [email or web address] Date: 01/17/2023 Prepared by: Alethia Berthold Belvie Iribe  Exercises - Lower Trunk Rotation Stretch  - 1 x daily - 7 x weekly - 3 sets - 10 reps - Posterior pelvic tilt  - 1 x daily - 7 x weekly - 3 sets - 10 reps - Supine Bridge with Pelvic Floor Contraction  - 1 x daily - 7 x weekly - 3 sets - 10 reps - Sidelying Thoracic Rotation with Open Book  - 1 x daily - 7 x weekly - 3 sets - 10 reps - Supine Straight Leg Lumbar Rotation Stretch  - 1 x daily - 7 x weekly - 3 sets - 10 reps - Cat Cow  - 1 x daily - 7 x weekly - 3 sets - 10 reps - Single Arm Doorway Pec Stretch at 90 Degrees Abduction  - 1 x daily - 7 x weekly - 3 sets - 45-60 seconds hold - Prone Chest Stretch on Chair  - 1 x daily - 7 x weekly - 3 sets - 30-45 second hold - Standing Anti-Rotation Press with Anchored Resistance  - 1 x daily - 7 x weekly - 3 sets - 10 reps  ASSESSMENT:  CLINICAL IMPRESSION: Emphasis of skilled PT session on improved BLE strength, core stability and hip abductor strength. Pt tolerated session well w/no report of pain but demonstrates decreased functional stability on RLE >LLE. Continue POC.    OBJECTIVE IMPAIRMENTS: decreased activity tolerance, decreased mobility, decreased ROM, decreased strength, increased muscle spasms, impaired flexibility, impaired UE functional use, improper body mechanics, and pain.   ACTIVITY LIMITATIONS: carrying, lifting, standing, reach over head, and  locomotion level  PARTICIPATION LIMITATIONS: cleaning, laundry, community activity, and occupation  PERSONAL FACTORS: Fitness and occupation  are also affecting patient's functional outcome.   REHAB POTENTIAL: Good  CLINICAL DECISION MAKING: Stable/uncomplicated  EVALUATION COMPLEXITY: Low   GOALS: Goals reviewed with patient? Yes  SHORT TERM GOALS: Target date: 02/06/2023   Pt will be independent with initial HEP for improved strength, spinal mobility and reduced pain levels.  Baseline: not established on eval  Goal status: INITIAL  2.  Modified Oswestry to be assessed and LTG updated  Baseline: 14/50 Goal status: MET  3.  Pt will demonstrate proper lifting technique of 20# object from floor and from overhead shelf for improved body mechanics at work and reduced pain levels  Baseline:  Goal status: INITIAL   LONG TERM GOALS: Target date: 02/20/2023   Pt will be independent with final HEP for improved strength, spinal mobility and reduced pain levels. Baseline:  Goal status: INITIAL  2.  Pt will improve score on Modified Oswestry to </= 8/50 for reduced pain levels and improved quality of life   Baseline: 14/50 Goal status: REVISED  3.  Pt will improve R lumbar rotation to normal limits w/min pain for improved functional mobility and body mechanics  Baseline: lacking 50% ROM w/moderate pain  Goal status: INITIAL    PLAN:  PT FREQUENCY: 2x/week  PT DURATION: 6 weeks  PLANNED INTERVENTIONS: 97164- PT Re-evaluation, 97110-Therapeutic exercises, 97530- Therapeutic activity, 97112- Neuromuscular re-education, 97535- Self Care, 47829- Manual therapy, [id number]- Gait training, [phone]- Aquatic Therapy, [phone]- Electrical stimulation (manual), Balance training, Taping, Dry Needling, Joint mobilization, Joint manipulation, Spinal manipulation, and Spinal mobilization.  PLAN FOR NEXT SESSION: Core stability. Lat rollout. Rotation and OH work. Add  to HEP for improved spinal  mobility and periscapular strength. Lifting technique. Hip abductor strength, waiter's carries   Check all possible CPT codes: See Planned Interventions List for Planned CPT Codes    Check all conditions that are expected to impact treatment: Contractures, spasticity or fracture relevant to requested treatment   If treatment provided at initial evaluation, no treatment charged due to lack of authorization.       Jill Alexanders Kassim Guertin, PT, DPT 01/24/2023, 8:45 AM

## 2023-01-29 ENCOUNTER — Ambulatory Visit: Payer: Medicaid Other | Admitting: Physical Therapy

## 2023-01-31 ENCOUNTER — Ambulatory Visit: Payer: Medicaid Other | Admitting: Physical Therapy

## 2023-01-31 DIAGNOSIS — M5459 Other low back pain: Secondary | ICD-10-CM

## 2023-01-31 DIAGNOSIS — M6281 Muscle weakness (generalized): Secondary | ICD-10-CM

## 2023-01-31 DIAGNOSIS — R2689 Other abnormalities of gait and mobility: Secondary | ICD-10-CM

## 2023-01-31 NOTE — Therapy (Signed)
OUTPATIENT PHYSICAL THERAPY THORACOLUMBAR TREATMENT   Patient Name: Rebecca Blackburn MRN: [id number] DOB:March 05, 1986, 37 y.o., female Today's Date: 01/31/2023  END OF SESSION:  PT End of Session - 01/31/23 0805     Visit Number 6    Number of Visits 13   Plus eval   Date for PT Re-Evaluation 03/06/23    Authorization Type [place] Medicaid [employer]    Progress Note Due on Visit 10    PT Start Time 0804   Pt arrived late   PT Stop Time 0844    PT Time Calculation (min) 40 min    Activity Tolerance Patient tolerated treatment well    Behavior During Therapy [facility] for tasks assessed/performed                Past Medical History:  Diagnosis Date   Asthma    High blood pressure    Migraine    Migraines    Varicella    Past Surgical History:  Procedure Laterality Date   CESAREAN SECTION     2016 and 2006   LUMBAR MICRODISCECTOMY  06/24/2020   SKIN BIOPSY     TONSILLECTOMY     tooth removal Right    Patient Active Problem List   Diagnosis Date Noted   Varicose 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 04/16/2022   Chronic neck pain 10/19/2020   Myofascial pain syndrome, cervical 10/19/2020   History of 2019 novel coronavirus disease (COVID-19) 09/06/2020   Mild intermittent asthma without complication 09/06/2020   S/P lumbar microdiscectomy 07/25/2020   COVID-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 06/14/2020   Nasal turbinate hypertrophy 03/15/2020   Corn of foot 01/28/2020   Chronic nonintractable headache 01/04/2020   Deviated septum 01/04/2020   ETD (Eustachian tube dysfunction), bilateral 01/04/2020   Migraine with aura and without status migrainosus, not intractable 03/18/2019   Seborrheic dermatitis 02/13/2019   Dizziness 01/13/2019   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   ASCUS of cervix with negative high risk HPV 11/20/2018   Encounter for gynecological examination without abnormal finding 11/07/2018   Genital warts 09/30/2018   History of loop electrical excision  procedure (LEEP) 09/30/2018   Arthritis of neck 01/23/2018   Essential hypertension 01/23/2018   GERD with esophagitis 01/23/2018   Migraine without aura and without status migrainosus, not intractable 01/23/2018   Morbid obesity due to excess calories (HCC) 01/23/2018    PCP: Lavonda Jumbo, PA-C  REFERRING PROVIDER: Su Monks, PA-C  REFERRING DIAG: M54.50 (ICD-10-CM) - Low back pain, unspecified M54.6 (ICD-10-CM) - Pain in thoracic spine  Rationale for Evaluation and Treatment: Rehabilitation  THERAPY DIAG:  Other low back pain  Muscle weakness (generalized)  Other abnormalities of gait and mobility  ONSET DATE: 01/01/2023 (referral)   SUBJECTIVE:  SUBJECTIVE STATEMENT: Pt reports feeling being very sore today, worked two 8 hour shifts in a row. Feels as though her pain is moving around, is now more in her mid-back rather than low back. Rotation is painful and she feels as though the pain is worse when she moves her arms. Rating her soreness as a 7/10 today.   PERTINENT HISTORY:  Had a lumbar discectomy 2 years ago   PAIN:  Are you having pain? No States her back is achy  PRECAUTIONS: Fall  RED FLAGS: None   WEIGHT BEARING RESTRICTIONS: No  FALLS:  Has patient fallen in last 6 months? 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  DIAGNOSTIC FINDINGS:  X-Ray of Lumbar spine from 12/18/2022 IMPRESSION:   1.  Slight retrolisthesis of L2 on L3 and L3 on L4.  2.  Dextroconvex curvature of the lumbar spine with weightbearing.  3.  Mild multilevel degenerative disc height loss.  4.  No acute fractures identified.   PATIENT SURVEYS:  Modified Oswestry 14/50 (11/7)   SCREENING FOR RED FLAGS: Bowel or bladder incontinence: No Spinal tumors: No Cauda equina syndrome: No Compression fracture: No Abdominal aneurysm: No  COGNITION: Overall cognitive status: Within functional limits for tasks assessed     SENSATION: Pt denies numbness/tingling of BLEs but states she occasionally has numbness above her right latissimus dorsi m.  POSTURE: rounded shoulders, forward head, and increased lumbar lordosis  PALPATION: TTP along origin points of R lat. Dorsi m., R paraspinals and R upper trap   LUMBAR ROM: Tested in standing position   AROM eval  Flexion WNL - decreased lumbar lordosis   Extension WNL  Right lateral flexion WNL, minor pain  Left lateral flexion WNL  Right rotation Lacking 50% ROM, moderate pain  Left rotation WNL, minor pain   (Blank rows = not tested)   GAIT: Distance walked: Various clinic distances  Assistive device utilized: None Level of assistance: Complete Independence Comments: Decreased gait speed, waddle-like gait pattern w/decreased truncal rotation   VITALS There were no vitals filed for this visit.   TODAY'S TREATMENT:         Ther Ex  SciFit multi-peaks level 10 for 8 minutes using BUE/BLEs for neural priming for reciprocal movement, dynamic cardiovascular warmup and endurance.  PVC goodmornings w/rotation, x12 reps, for improved thoracic rotation and pain modulation. Pt reported feeling more of a stretch on L side > R side.  Modified plantigrade thread the needles at ballet bar, x8 reps per side, for improved thoracic mobility and anterior shoulder stretch. Pt reported feeling more mobile when rotating to R side > L  Supine lying on two yoga blocks placed  behind head on level two and at T6 on level 1 for improved upper thoracic mobility and pain modulation, x2 minutes. Added supine shoulder in scapular plane , x12 reps. Noted decreased ROM of LUE, but pt reported tightness of RUE Alt marching w/unilateral 12# KB OH hold w/2s isometric hold for improved functional core stability. Pt more challenged when holding KB on L side > R side.  PATIENT EDUCATION:  Education details: Continue HEP Person educated: Patient Education method: Explanation Education comprehension: verbalized understanding  HOME EXERCISE PROGRAM: Access Code: [email or web address] URL: [email or web address] Date: 01/17/2023 Prepared by: Alethia Berthold Donnamaria Shands  Exercises - Lower Trunk Rotation Stretch  - 1 x daily - 7 x weekly - 3 sets - 10 reps - Posterior pelvic tilt  - 1 x daily - 7 x weekly - 3 sets - 10 reps - Supine Bridge with Pelvic Floor Contraction  - 1 x daily - 7 x weekly - 3 sets - 10 reps - Sidelying Thoracic Rotation with Open Book  - 1 x daily - 7 x weekly - 3 sets - 10 reps - Supine Straight Leg Lumbar Rotation Stretch  - 1 x daily - 7 x weekly - 3 sets - 10 reps - Cat Cow  - 1 x daily - 7 x weekly - 3 sets - 10 reps - Single Arm Doorway Pec Stretch at 90 Degrees Abduction  - 1 x daily - 7 x weekly - 3 sets - 45-60 seconds hold - Prone Chest Stretch on Chair  - 1 x daily - 7 x weekly - 3 sets - 30-45 second hold - Standing Anti-Rotation Press with Anchored Resistance  - 1 x daily - 7 x weekly - 3 sets - 10 reps  ASSESSMENT:  CLINICAL IMPRESSION: Emphasis of skilled PT session on functional core stability, thoracic mobility and pain modulation. Pt reports her pain has moved up her back as is around T6 area now. Pt states she feels more tightness on R side but demonstrates decreased mobility on L side. Continue POC.    OBJECTIVE IMPAIRMENTS:  decreased activity tolerance, decreased mobility, decreased ROM, decreased strength, increased muscle spasms, impaired flexibility, impaired UE functional use, improper body mechanics, and pain.   ACTIVITY LIMITATIONS: carrying, lifting, standing, reach over head, and locomotion level  PARTICIPATION LIMITATIONS: cleaning, laundry, community activity, and occupation  PERSONAL FACTORS: Fitness and occupation  are also affecting patient's functional outcome.   REHAB POTENTIAL: Good  CLINICAL DECISION MAKING: Stable/uncomplicated  EVALUATION COMPLEXITY: Low   GOALS: Goals reviewed with patient? Yes  SHORT TERM GOALS: Target date: 02/06/2023   Pt will be independent with initial HEP for improved strength, spinal mobility and reduced pain levels.  Baseline: not established on eval  Goal status: INITIAL  2.  Modified Oswestry to be assessed and LTG updated  Baseline: 14/50 Goal status: MET  3.  Pt will demonstrate proper lifting technique of 20# object from floor and from overhead shelf for improved body mechanics at work and reduced pain levels  Baseline:  Goal status: INITIAL   LONG TERM GOALS: Target date: 02/20/2023   Pt will be independent with final HEP for improved strength, spinal mobility and reduced pain levels. Baseline:  Goal status: INITIAL  2.  Pt will improve score on Modified Oswestry to </= 8/50 for reduced pain levels and improved quality of life   Baseline: 14/50 Goal status: REVISED  3.  Pt will improve R lumbar rotation to normal limits w/min pain for improved functional mobility and body mechanics  Baseline: lacking 50% ROM w/moderate pain  Goal status: INITIAL    PLAN:  PT FREQUENCY: 2x/week  PT DURATION: 6 weeks  PLANNED INTERVENTIONS: 97164- PT Re-evaluation, 97110-Therapeutic exercises, 97530- Therapeutic activity, 97112- Neuromuscular re-education, 97535- Self Care, 29518- Manual therapy, [id number]- Gait training, [phone]- Aquatic Therapy,  [phone]- Electrical stimulation (manual), Balance training, Taping, Dry Needling, Joint mobilization, Joint manipulation, Spinal manipulation, and Spinal  mobilization.  PLAN FOR NEXT SESSION: Core stability. Lat rollout. Rotation and OH work. Add to HEP for improved spinal mobility and periscapular strength. Lifting technique. Hip abductor strength, waiter's carries  Check all possible CPT codes: See Planned Interventions List for Planned CPT Codes    Check all conditions that are expected to impact treatment: Contractures, spasticity or fracture relevant to requested treatment   If treatment provided at initial evaluation, no treatment charged due to lack of authorization.       Jill Alexanders Abhinav Mayorquin, PT, DPT 01/31/2023, 8:44 AM

## 2023-02-05 ENCOUNTER — Ambulatory Visit: Payer: Medicaid Other | Admitting: Physical Therapy

## 2023-02-05 DIAGNOSIS — M5459 Other low back pain: Secondary | ICD-10-CM

## 2023-02-05 DIAGNOSIS — M6281 Muscle weakness (generalized): Secondary | ICD-10-CM

## 2023-02-05 DIAGNOSIS — R2689 Other abnormalities of gait and mobility: Secondary | ICD-10-CM

## 2023-02-05 NOTE — Therapy (Signed)
OUTPATIENT PHYSICAL THERAPY THORACOLUMBAR TREATMENT   Patient Name: Rebecca Blackburn MRN: [id number] DOB:1985-08-21, 37 y.o., female Today's Date: 02/05/2023  END OF SESSION:  PT End of Session - 02/05/23 0806     Visit Number 7    Number of Visits 13   Plus eval   Date for PT Re-Evaluation 03/06/23    Authorization Type [place] Medicaid [employer]    Progress Note Due on Visit 10    PT Start Time 0805    PT Stop Time 0844    PT Time Calculation (min) 39 min    Activity Tolerance Patient tolerated treatment well    Behavior During Therapy WFL for tasks assessed/performed                Past Medical History:  Diagnosis Date   Asthma    High blood pressure    Migraine    Migraines    Varicella    Past Surgical History:  Procedure Laterality Date   CESAREAN SECTION     2016 and 2006   LUMBAR MICRODISCECTOMY  06/24/2020   SKIN BIOPSY     TONSILLECTOMY     tooth removal Right    Patient Active Problem List   Diagnosis Date Noted   Varicose 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 04/16/2022   Chronic neck pain 10/19/2020   Myofascial pain syndrome, cervical 10/19/2020   History of 2019 novel coronavirus disease (COVID-19) 09/06/2020   Mild intermittent asthma without complication 09/06/2020   S/P lumbar microdiscectomy 07/25/2020   COVID-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 06/14/2020   Nasal turbinate hypertrophy 03/15/2020   Corn of foot 01/28/2020   Chronic nonintractable headache 01/04/2020   Deviated septum 01/04/2020   ETD (Eustachian tube dysfunction), bilateral 01/04/2020   Migraine with aura and without status migrainosus, not intractable 03/18/2019   Seborrheic dermatitis 02/13/2019   Dizziness 01/13/2019   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   ASCUS of cervix with negative high risk HPV 11/20/2018   Encounter for gynecological examination without abnormal finding 11/07/2018   Genital warts 09/30/2018   History of loop electrical excision procedure (LEEP)  09/30/2018   Arthritis of neck 01/23/2018   Essential hypertension 01/23/2018   GERD with esophagitis 01/23/2018   Migraine without aura and without status migrainosus, not intractable 01/23/2018   Morbid obesity due to excess calories (HCC) 01/23/2018    PCP: Lavonda Jumbo, PA-C  REFERRING PROVIDER: Su Monks, PA-C  REFERRING DIAG: M54.50 (ICD-10-CM) - Low back pain, unspecified M54.6 (ICD-10-CM) - Pain in thoracic spine  Rationale for Evaluation and Treatment: Rehabilitation  THERAPY DIAG:  Other low back pain  Muscle weakness (generalized)  Other abnormalities of gait and mobility  ONSET DATE: 01/01/2023 (referral)   SUBJECTIVE:  SUBJECTIVE STATEMENT: Pt reports feeling better today. Feels as though her pain continues to migrate up towards her bra line. Is having hip pain on the R side today, close to bikini line. Worked 8 hours yesterday and did not do any exercise but felt okay. HEP is going well.   PERTINENT HISTORY:  Had a lumbar discectomy 2 years ago   PAIN:  Are you having pain? No States her back is achy  PRECAUTIONS: Fall  RED FLAGS: None   WEIGHT BEARING RESTRICTIONS: No  FALLS:  Has patient fallen in last 6 months? 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  DIAGNOSTIC FINDINGS:  X-Ray of Lumbar spine from 12/18/2022 IMPRESSION:   1. Slight retrolisthesis of L2 on L3 and L3 on L4.  2.   Dextroconvex curvature of the lumbar spine with weightbearing.  3.  Mild multilevel degenerative disc height loss.  4.  No acute fractures identified.   PATIENT SURVEYS:  Modified Oswestry 14/50 (11/7)   SCREENING FOR RED FLAGS: Bowel or bladder incontinence: No Spinal tumors: No Cauda equina syndrome: No Compression fracture: No Abdominal aneurysm: No  COGNITION: Overall cognitive status: Within functional limits for tasks assessed     SENSATION: Pt denies numbness/tingling of BLEs but states she occasionally has numbness above her right latissimus dorsi m.  POSTURE: rounded shoulders, forward head, and increased lumbar lordosis  PALPATION: TTP along origin points of R lat. Dorsi m., R paraspinals and R upper trap   LUMBAR ROM: Tested in standing position   AROM eval  Flexion WNL - decreased lumbar lordosis   Extension WNL  Right lateral flexion WNL, minor pain  Left lateral flexion WNL  Right rotation Lacking 50% ROM, moderate pain  Left rotation WNL, minor pain   (Blank rows = not tested)   GAIT: Distance walked: Various clinic distances  Assistive device utilized: None Level of assistance: Complete Independence Comments: Decreased gait speed, waddle-like gait pattern w/decreased truncal rotation   VITALS There were no vitals filed for this visit.   TODAY'S TREATMENT:         Ther Ex  Adductor rock backs, x10 per side, for improved hip mobility and pain modulation. Pt reported increased tightness on RLE > LLE. Added to HEP (see bolded below) Half kneel hip flexor stretch, x3 minutes per side, for improved hip flexor mobility and pain modulation. Added ipsilateral OH reach for added lat stretch and spinal mobility. Added to HEP (see bolded below) Happy baby w/green strap due to decreased mobility, x4 minutes for improved hip and spinal mobility.  Multifidus rotations on yoga block in quadruped, x15 reps per side, for improved spinal stability.  DL to 15" box  using 82# KB, x12 reps, for improved facilitation of hip hinge, posterior chain strength and proper lifting technique.  Deadlifts w/20# KB, x12 reps, for posterior chain strength and proper lifting technique. Min cues for proper form initially (bending knees at bottom), but pt able to maintain neutral spine position throughout. No pain in spine reported w/activity.  PATIENT EDUCATION:  Education details: Continue HEP Person educated: Patient Education method: Explanation Education comprehension: verbalized understanding  HOME EXERCISE PROGRAM: Access Code: [email or web address] URL: [email or web address] Date: 01/17/2023 Prepared by: Alethia Berthold Amanpreet Delmont  Exercises - Lower Trunk Rotation Stretch  - 1 x daily - 7 x weekly - 3 sets - 10 reps - Posterior pelvic tilt  - 1 x daily - 7 x weekly - 3 sets - 10 reps - Supine Bridge with Pelvic Floor Contraction  - 1 x daily - 7 x weekly - 3 sets - 10 reps - Sidelying Thoracic Rotation with Open Book  - 1 x daily - 7 x weekly - 3 sets - 10 reps - Supine Straight Leg Lumbar Rotation Stretch  - 1 x daily - 7 x weekly - 3 sets - 10 reps - Cat Cow  - 1 x daily - 7 x weekly - 3 sets - 10 reps - Single Arm Doorway Pec Stretch at 90 Degrees Abduction  - 1 x daily - 7 x weekly - 3 sets - 45-60 seconds hold - Prone Chest Stretch on Chair  - 1 x daily - 7 x weekly - 3 sets - 30-45 second hold - Standing Anti-Rotation Press with Anchored Resistance  - 1 x daily - 7 x weekly - 3 sets - 10 reps - Kneeling Adductor Stretch with Hip External Rotation  - 1 x daily - 7 x weekly - 3 sets - 10 reps - Half Kneeling Hip Flexor Stretch  - 1 x daily - 7 x weekly - 3 sets - 30-60 second hold  ASSESSMENT:  CLINICAL IMPRESSION: Emphasis of skilled PT session on improved hip mobility, spinal mobility and core stability. Pt reporting tightness in her R hip this date that did reduce  w/gentle mobility. Pt able to demonstrate proper lifting technique w/no pain as well, partially meeting her STG #3. Will continue to work on upper thoracic mobility and OH reaching tomorrow. Continue POC.    OBJECTIVE IMPAIRMENTS: decreased activity tolerance, decreased mobility, decreased ROM, decreased strength, increased muscle spasms, impaired flexibility, impaired UE functional use, improper body mechanics, and pain.   ACTIVITY LIMITATIONS: carrying, lifting, standing, reach over head, and locomotion level  PARTICIPATION LIMITATIONS: cleaning, laundry, community activity, and occupation  PERSONAL FACTORS: Fitness and occupation  are also affecting patient's functional outcome.   REHAB POTENTIAL: Good  CLINICAL DECISION MAKING: Stable/uncomplicated  EVALUATION COMPLEXITY: Low   GOALS: Goals reviewed with patient? Yes  SHORT TERM GOALS: Target date: 02/06/2023   Pt will be independent with initial HEP for improved strength, spinal mobility and reduced pain levels.  Baseline: not established on eval  Goal status: INITIAL  2.  Modified Oswestry to be assessed and LTG updated  Baseline: 14/50 Goal status: MET  3.  Pt will demonstrate proper lifting technique of 20# object from floor and from overhead shelf for improved body mechanics at work and reduced pain levels  Baseline: 20# DL on 54/09 Goal status: PARTIALLY MET   LONG TERM GOALS: Target date: 02/20/2023   Pt will be independent with final HEP for improved strength, spinal mobility and reduced pain levels. Baseline:  Goal status: INITIAL  2.  Pt will improve score on Modified Oswestry to </= 8/50 for reduced pain levels and improved quality of life   Baseline: 14/50 Goal status: REVISED  3.  Pt will improve R lumbar rotation to normal limits w/min pain for improved functional mobility and body mechanics  Baseline: lacking 50% ROM w/moderate pain  Goal status: INITIAL    PLAN:  PT FREQUENCY: 2x/week  PT  DURATION: 6 weeks  PLANNED INTERVENTIONS: 97164- PT Re-evaluation, 97110-Therapeutic exercises, 97530- Therapeutic activity, 97112- Neuromuscular re-education, 97535- Self Care, 08657- Manual therapy, [phone]- Gait training, [phone]- Aquatic Therapy, [phone]- Electrical stimulation (manual), Balance training, Taping, Dry Needling, Joint mobilization, Joint manipulation, Spinal manipulation, and Spinal mobilization.  PLAN FOR NEXT SESSION: Core stability. Lat rollout. Rotation and OH work. Add to HEP for improved spinal mobility and periscapular strength. Lifting technique. Hip abductor strength, waiter's carries  Check all possible CPT codes: See Planned Interventions List for Planned CPT Codes    Check all conditions that are expected to impact treatment: Contractures, spasticity or fracture relevant to requested treatment   If treatment provided at initial evaluation, no treatment charged due to lack of authorization.       Jill Alexanders Stpehen Petitjean, PT, DPT 02/05/2023, 8:44 AM

## 2023-02-06 ENCOUNTER — Ambulatory Visit: Payer: Medicaid Other | Admitting: Physical Therapy

## 2023-02-06 DIAGNOSIS — M5459 Other low back pain: Secondary | ICD-10-CM | POA: Diagnosis not present

## 2023-02-06 DIAGNOSIS — R2689 Other abnormalities of gait and mobility: Secondary | ICD-10-CM

## 2023-02-06 DIAGNOSIS — M6281 Muscle weakness (generalized): Secondary | ICD-10-CM

## 2023-02-06 NOTE — Therapy (Signed)
OUTPATIENT PHYSICAL THERAPY THORACOLUMBAR TREATMENT   Patient Name: Rebecca Blackburn MRN: [id number] DOB:1985-08-29, 37 y.o., female Today's Date: 02/06/2023  END OF SESSION:  PT End of Session - 02/06/23 0809     Visit Number 8    Number of Visits 13   Plus eval   Date for PT Re-Evaluation 03/06/23    Authorization Type [place] Medicaid [employer]    Progress Note Due on Visit 10    PT Start Time 0808   Pt arrived late   PT Stop Time 0845    PT Time Calculation (min) 37 min    Activity Tolerance Patient tolerated treatment well    Behavior During Therapy WFL for tasks assessed/performed                 Past Medical History:  Diagnosis Date   Asthma    High blood pressure    Migraine    Migraines    Varicella    Past Surgical History:  Procedure Laterality Date   CESAREAN SECTION     2016 and 2006   LUMBAR MICRODISCECTOMY  06/24/2020   SKIN BIOPSY     TONSILLECTOMY     tooth removal Right    Patient Active Problem List   Diagnosis Date Noted   Varicose 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 04/16/2022   Chronic neck pain 10/19/2020   Myofascial pain syndrome, cervical 10/19/2020   History of 2019 novel coronavirus disease (COVID-19) 09/06/2020   Mild intermittent asthma without complication 09/06/2020   S/P lumbar microdiscectomy 07/25/2020   COVID-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 06/14/2020   Nasal turbinate hypertrophy 03/15/2020   Corn of foot 01/28/2020   Chronic nonintractable headache 01/04/2020   Deviated septum 01/04/2020   ETD (Eustachian tube dysfunction), bilateral 01/04/2020   Migraine with aura and without status migrainosus, not intractable 03/18/2019   Seborrheic dermatitis 02/13/2019   Dizziness 01/13/2019   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   ASCUS of cervix with negative high risk HPV 11/20/2018   Encounter for gynecological examination without abnormal finding 11/07/2018   Genital warts 09/30/2018   History of loop electrical excision  procedure (LEEP) 09/30/2018   Arthritis of neck 01/23/2018   Essential hypertension 01/23/2018   GERD with esophagitis 01/23/2018   Migraine without aura and without status migrainosus, not intractable 01/23/2018   Morbid obesity due to excess calories (HCC) 01/23/2018    PCP: Lavonda Jumbo, PA-C  REFERRING PROVIDER: Su Monks, PA-C  REFERRING DIAG: M54.50 (ICD-10-CM) - Low back pain, unspecified M54.6 (ICD-10-CM) - Pain in thoracic spine  Rationale for Evaluation and Treatment: Rehabilitation  THERAPY DIAG:  Other low back pain  Muscle weakness (generalized)  Other abnormalities of gait and mobility  ONSET DATE: 01/01/2023 (referral)   SUBJECTIVE:  SUBJECTIVE STATEMENT: Pt reports her work shift went well yesterday, felt stiff last night and is a bit sore this morning. Rating soreness as a 5/10   PERTINENT HISTORY:  Had a lumbar discectomy 2 years ago   PAIN:  Are you having pain? No States her back is achy  PRECAUTIONS: Fall  RED FLAGS: None   WEIGHT BEARING RESTRICTIONS: No  FALLS:  Has patient fallen in last 6 months? 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  DIAGNOSTIC FINDINGS:  X-Ray of Lumbar spine from 12/18/2022 IMPRESSION:   1. Slight retrolisthesis of L2 on L3 and L3 on L4.  2.  Dextroconvex curvature of the lumbar spine with weightbearing.  3.  Mild multilevel degenerative disc height loss.   4.  No acute fractures identified.   PATIENT SURVEYS:  Modified Oswestry 14/50 (11/7)   SCREENING FOR RED FLAGS: Bowel or bladder incontinence: No Spinal tumors: No Cauda equina syndrome: No Compression fracture: No Abdominal aneurysm: No  COGNITION: Overall cognitive status: Within functional limits for tasks assessed     SENSATION: Pt denies numbness/tingling of BLEs but states she occasionally has numbness above her right latissimus dorsi m.  POSTURE: rounded shoulders, forward head, and increased lumbar lordosis  PALPATION: TTP along origin points of R lat. Dorsi m., R paraspinals and R upper trap   LUMBAR ROM: Tested in standing position   AROM eval  Flexion WNL - decreased lumbar lordosis   Extension WNL  Right lateral flexion WNL, minor pain  Left lateral flexion WNL  Right rotation Lacking 50% ROM, moderate pain  Left rotation WNL, minor pain   (Blank rows = not tested)   GAIT: Distance walked: Various clinic distances  Assistive device utilized: None Level of assistance: Complete Independence Comments: Decreased gait speed, waddle-like gait pattern w/decreased truncal rotation   VITALS There were no vitals filed for this visit.   TODAY'S TREATMENT:         Ther Ex  SciFit multi-peaks level 8 for 8 minutes using BUE/BLEs for neural priming for dynamic cardiovascular warmup and improved mobility.  Supine I, Y, A's over gumdrops, x10 reps, for improved scapular retraction and upper back strength. Pt reported feeling good with this. In prone, noted L hip more elevated than R hip.  Waiter's carries w/20# KB, 3x20' each side for improved core stability, proper body mechanics holding trays at work and global strength. Standing oblique crunches w/25# KB, x12 reps per side, for improved core stability and lat stretch.  Door lat stretch w/gray theraband, x2 minutes per side, for improved lat mobility and pain modulation.  PATIENT EDUCATION:  Education details: Continue HEP Person educated: Patient Education method: Explanation Education comprehension: verbalized understanding  HOME EXERCISE PROGRAM: Access Code: [email or web address] URL: [email or web address] Date: 01/17/2023 Prepared by: Alethia Berthold Alanii Ramer  Exercises - Lower Trunk Rotation Stretch  - 1 x daily - 7 x weekly - 3 sets - 10 reps - Posterior pelvic tilt  - 1 x daily - 7 x weekly - 3 sets - 10 reps - Supine Bridge with Pelvic Floor Contraction  - 1 x daily - 7 x weekly - 3 sets - 10 reps - Sidelying Thoracic Rotation with Open Book  - 1 x daily - 7 x weekly - 3 sets - 10 reps - Supine Straight Leg Lumbar Rotation Stretch  - 1 x daily - 7 x weekly - 3 sets - 10 reps - Cat Cow  - 1 x daily - 7 x weekly - 3 sets - 10 reps - Single Arm Doorway Pec Stretch at 90 Degrees Abduction  - 1 x daily - 7 x weekly - 3 sets - 45-60 seconds hold - Prone Chest Stretch on Chair  - 1 x daily - 7 x weekly - 3 sets - 30-45 second hold - Standing Anti-Rotation Press with Anchored Resistance  - 1 x daily - 7 x weekly - 3 sets - 10 reps - Kneeling Adductor Stretch with Hip External Rotation  - 1 x daily - 7 x weekly - 3 sets - 10 reps - Half Kneeling Hip Flexor Stretch  - 1 x daily - 7 x weekly - 3 sets - 30-60 second hold  ASSESSMENT:  CLINICAL IMPRESSION: Session limited due to pt's late arrival. Emphasis of skilled PT session on improved periscapular strength, lat mobility and pain modulation. Pt tolerated session well and reported feeling the best w/prone scapular retraction. Pt continues to report soreness/tightness in mid-thoracic spine, but has improved since eval. Continue POC.    OBJECTIVE IMPAIRMENTS: decreased activity tolerance, decreased mobility, decreased ROM, decreased strength, increased muscle spasms, impaired flexibility, impaired UE functional use, improper body mechanics, and pain.    ACTIVITY LIMITATIONS: carrying, lifting, standing, reach over head, and locomotion level  PARTICIPATION LIMITATIONS: cleaning, laundry, community activity, and occupation  PERSONAL FACTORS: Fitness and occupation  are also affecting patient's functional outcome.   REHAB POTENTIAL: Good  CLINICAL DECISION MAKING: Stable/uncomplicated  EVALUATION COMPLEXITY: Low   GOALS: Goals reviewed with patient? Yes  SHORT TERM GOALS: Target date: 02/06/2023   Pt will be independent with initial HEP for improved strength, spinal mobility and reduced pain levels.  Baseline: not established on eval  Goal status: MET  2.  Modified Oswestry to be assessed and LTG updated  Baseline: 14/50 Goal status: MET  3.  Pt will demonstrate proper lifting technique of 20# object from floor and from overhead shelf for improved body mechanics at work and reduced pain levels  Baseline: 20# DL on 78/46 Goal status: PARTIALLY MET   LONG TERM GOALS: Target date: 02/20/2023   Pt will be independent with final HEP for improved strength, spinal mobility and reduced pain levels. Baseline:  Goal status: INITIAL  2.  Pt will improve score on Modified Oswestry to </= 8/50 for reduced pain levels and improved quality of life   Baseline: 14/50 Goal status: REVISED  3.  Pt will improve R lumbar rotation to normal limits w/min pain for improved functional mobility and body mechanics  Baseline: lacking 50% ROM w/moderate pain  Goal status: INITIAL    PLAN:  PT FREQUENCY:  2x/week  PT DURATION: 6 weeks  PLANNED INTERVENTIONS: 97164- PT Re-evaluation, 97110-Therapeutic exercises, 97530- Therapeutic activity, 97112- Neuromuscular re-education, 97535- Self Care, 16109- Manual therapy, [phone]- Gait training, [phone]- Aquatic Therapy, [phone]- Electrical stimulation (manual), Balance training, Taping, Dry Needling, Joint mobilization, Joint manipulation, Spinal manipulation, and Spinal mobilization.  PLAN FOR NEXT  SESSION: Core stability. Lat rollout. Rotation and OH work. Add to HEP for improved spinal mobility and periscapular strength. Lifting technique. Hip abductor strength, waiter's carries  Check all possible CPT codes: See Planned Interventions List for Planned CPT Codes    Check all conditions that are expected to impact treatment: Contractures, spasticity or fracture relevant to requested treatment   If treatment provided at initial evaluation, no treatment charged due to lack of authorization.       Jill Alexanders Romonia Yanik, PT, DPT 02/06/2023, 8:45 AM

## 2023-02-12 ENCOUNTER — Ambulatory Visit: Payer: Medicaid Other | Admitting: Physical Therapy

## 2023-02-14 ENCOUNTER — Ambulatory Visit: Payer: Medicaid Other | Attending: Physician Assistant | Admitting: Physical Therapy

## 2023-02-14 DIAGNOSIS — M5459 Other low back pain: Secondary | ICD-10-CM | POA: Diagnosis present

## 2023-02-14 DIAGNOSIS — R2689 Other abnormalities of gait and mobility: Secondary | ICD-10-CM | POA: Insufficient documentation

## 2023-02-14 DIAGNOSIS — M6281 Muscle weakness (generalized): Secondary | ICD-10-CM | POA: Diagnosis present

## 2023-02-14 NOTE — Therapy (Signed)
OUTPATIENT PHYSICAL THERAPY THORACOLUMBAR TREATMENT   Patient Name: Rebecca Blackburn MRN: [id number] DOB:August 26, 1985, 37 y.o., female Today's Date: 02/14/2023  END OF SESSION:  PT End of Session - 02/14/23 0811     Visit Number 9    Number of Visits 13   Plus eval   Date for PT Re-Evaluation 03/06/23    Authorization Type [place] Medicaid [employer]    Progress Note Due on Visit 10    PT Start Time 0810   Pt arrived late   PT Stop Time 0844    PT Time Calculation (min) 34 min    Activity Tolerance Patient tolerated treatment well    Behavior During Therapy [facility] for tasks assessed/performed                  Past Medical History:  Diagnosis Date   Asthma    High blood pressure    Migraine    Migraines    Varicella    Past Surgical History:  Procedure Laterality Date   CESAREAN SECTION     2016 and 2006   LUMBAR MICRODISCECTOMY  06/24/2020   SKIN BIOPSY     TONSILLECTOMY     tooth removal Right    Patient Active Problem List   Diagnosis Date Noted   Varicose 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 04/16/2022   Chronic neck pain 10/19/2020   Myofascial pain syndrome, cervical 10/19/2020   History of 2019 novel coronavirus disease (COVID-19) 09/06/2020   Mild intermittent asthma without complication 09/06/2020   S/P lumbar microdiscectomy 07/25/2020   COVID-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 06/14/2020   Nasal turbinate hypertrophy 03/15/2020   Corn of foot 01/28/2020   Chronic nonintractable headache 01/04/2020   Deviated septum 01/04/2020   ETD (Eustachian tube dysfunction), bilateral 01/04/2020   Migraine with aura and without status migrainosus, not intractable 03/18/2019   Seborrheic dermatitis 02/13/2019   Dizziness 01/13/2019   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   ASCUS of cervix with negative high risk HPV 11/20/2018   Encounter for gynecological examination without abnormal finding 11/07/2018   Genital warts 09/30/2018   History of loop electrical excision  procedure (LEEP) 09/30/2018   Arthritis of neck 01/23/2018   Essential hypertension 01/23/2018   GERD with esophagitis 01/23/2018   Migraine without aura and without status migrainosus, not intractable 01/23/2018   Morbid obesity due to excess calories (HCC) 01/23/2018    PCP: Lavonda Jumbo, PA-C  REFERRING PROVIDER: Su Monks, PA-C  REFERRING DIAG: M54.50 (ICD-10-CM) - Low back pain, unspecified M54.6 (ICD-10-CM) - Pain in thoracic spine  Rationale for Evaluation and Treatment: Rehabilitation  THERAPY DIAG:  Other low back pain  Muscle weakness (generalized)  Other abnormalities of gait and mobility  ONSET DATE: 01/01/2023 (referral)   SUBJECTIVE:  SUBJECTIVE STATEMENT: Pt reports being "bad" today. States she cannot lift her arms overhead without pain in her back. States her pain moves around, is lower today and feels more like spinal pain rather than muscle pain.   PERTINENT HISTORY:  Had a lumbar discectomy 2 years ago   PAIN:  Are you having pain? Yes: NPRS scale: 6/10 Pain location: mid-back Pain description: achy/catching    PRECAUTIONS: Fall  RED FLAGS: None   WEIGHT BEARING RESTRICTIONS: No  FALLS:  Has patient fallen in last 6 months? 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  DIAGNOSTIC FINDINGS:  X-Ray of Lumbar spine from 12/18/2022 IMPRESSION:   1. Slight retrolisthesis of L2 on L3 and L3 on  L4.  2.  Dextroconvex curvature of the lumbar spine with weightbearing.  3.  Mild multilevel degenerative disc height loss.  4.  No acute fractures identified.   PATIENT SURVEYS:  Modified Oswestry 14/50 (11/7)   SCREENING FOR RED FLAGS: Bowel or bladder incontinence: No Spinal tumors: No Cauda equina syndrome: No Compression fracture: No Abdominal aneurysm: No  COGNITION: Overall cognitive status: Within functional limits for tasks assessed     SENSATION: Pt denies numbness/tingling of BLEs but states she occasionally has numbness above her right latissimus dorsi m.  POSTURE: rounded shoulders, forward head, and increased lumbar lordosis  PALPATION: TTP along origin points of R lat. Dorsi m., R paraspinals and R upper trap   LUMBAR ROM: Tested in standing position   AROM eval  Flexion WNL - decreased lumbar lordosis   Extension WNL  Right lateral flexion WNL, minor pain  Left lateral flexion WNL  Right rotation Lacking 50% ROM, moderate pain  Left rotation WNL, minor pain   (Blank rows = not tested)   GAIT: Distance walked: Various clinic distances  Assistive device utilized: None Level of assistance: Complete Independence Comments: Decreased gait speed, waddle-like gait pattern w/decreased truncal rotation   VITALS There were no vitals filed for this visit.   TODAY'S TREATMENT:         Ther Ex  Supine lying w/half-foam roll under T-spine w/arms in OH flexion in scapular plane, x4 minutes, for improved spinal mobility and pain modulation. Progressed to adding LTRs while lying on foam, x10 reps per side. Finally progressed to alt supine marches while lying on foam, x10 reps per side.  Sidelying open books, x10 per side, for improved thoracic spine mobility. Noted increased ROM on R side > L side  Pt rated pain as 5/10 following session and was able to lift arms overhead without restriction.                                                                                                   PATIENT EDUCATION:  Education details: Continue HEP Person educated: Patient Education method: Explanation Education comprehension: verbalized understanding  HOME EXERCISE PROGRAM: Access Code: [email or web address] URL: [email or web address] Date: 01/17/2023 Prepared by: Alethia Berthold Ellieanna Funderburg  Exercises - Lower Trunk Rotation Stretch  - 1 x daily - 7 x weekly - 3 sets - 10 reps - Posterior pelvic tilt  - 1 x daily - 7 x weekly - 3 sets - 10 reps - Supine Bridge with Pelvic Floor Contraction  - 1 x daily - 7 x weekly - 3 sets - 10 reps - Sidelying Thoracic Rotation with Open Book  - 1 x daily - 7 x weekly - 3 sets - 10 reps - Supine Straight Leg Lumbar Rotation Stretch  - 1 x daily - 7 x weekly - 3 sets - 10 reps - Cat Cow  - 1 x daily - 7 x weekly - 3 sets - 10 reps - Single Arm Doorway Pec Stretch at 90 Degrees Abduction  - 1 x daily - 7 x weekly - 3 sets - 45-60 seconds hold - Prone Chest Stretch on Chair  - 1 x daily - 7 x weekly - 3 sets - 30-45 second hold - Standing Anti-Rotation Press with Anchored Resistance  - 1 x daily - 7 x weekly - 3 sets - 10 reps - Kneeling Adductor Stretch with Hip External Rotation  - 1 x daily - 7 x weekly - 3 sets - 10 reps - Half Kneeling Hip Flexor Stretch  - 1 x daily - 7 x weekly - 3 sets - 30-60 second hold  ASSESSMENT:  CLINICAL IMPRESSION: Session limited due to pt's late arrival. Emphasis of skilled PT session on improved thoracic spine mobility and pain modulation. Pt reports her pain has shifted to a midline position around T7 and she is unable to raise her arms overhead due to pain. Following gentle mobility exercises, pt reported decrease in pain and was able to lift arms overhead. Pt reports reduced tightness in lats and feels as though her pain is only in spine. Continue POC.    OBJECTIVE IMPAIRMENTS: decreased activity tolerance, decreased mobility, decreased ROM, decreased strength, increased muscle spasms, impaired  flexibility, impaired UE functional use, improper body mechanics, and pain.   ACTIVITY LIMITATIONS: carrying, lifting, standing, reach over head, and locomotion level  PARTICIPATION LIMITATIONS: cleaning, laundry, community activity, and occupation  PERSONAL FACTORS: Fitness and occupation  are also affecting patient's functional outcome.   REHAB POTENTIAL: Good  CLINICAL DECISION MAKING: Stable/uncomplicated  EVALUATION COMPLEXITY: Low   GOALS: Goals reviewed with patient? Yes  SHORT TERM GOALS: Target date: 02/06/2023   Pt will be independent with initial HEP for improved strength, spinal mobility and reduced pain levels.  Baseline: not established on eval  Goal status: MET  2.  Modified Oswestry to be assessed and LTG updated  Baseline: 14/50 Goal status: MET  3.  Pt will demonstrate proper lifting technique of 20# object from floor and from overhead shelf for improved body mechanics at work and reduced pain levels  Baseline: 20# DL on 47/82 Goal status: PARTIALLY MET   LONG TERM GOALS: Target date: 02/20/2023   Pt will be independent with final HEP for improved strength, spinal mobility and reduced pain levels. Baseline:  Goal status: INITIAL  2.  Pt will improve score on Modified Oswestry to </= 8/50 for reduced pain levels and improved quality of life   Baseline: 14/50 Goal status: REVISED  3.  Pt will improve R lumbar rotation to normal limits  w/min pain for improved functional mobility and body mechanics  Baseline: lacking 50% ROM w/moderate pain  Goal status: INITIAL    PLAN:  PT FREQUENCY: 2x/week  PT DURATION: 6 weeks  PLANNED INTERVENTIONS: 97164- PT Re-evaluation, 97110-Therapeutic exercises, 97530- Therapeutic activity, 97112- Neuromuscular re-education, 97535- Self Care, 16109- Manual therapy, [id number]- Gait training, [phone]- Aquatic Therapy, [phone]- Electrical stimulation (manual), Balance training, Taping, Dry Needling, Joint mobilization, Joint  manipulation, Spinal manipulation, and Spinal mobilization.  PLAN FOR NEXT SESSION: 10th visit PN. Core stability. Lat rollout. Rotation and OH work. Add to HEP for improved spinal mobility and periscapular strength. Lifting technique. Hip abductor strength, waiter's carries  Check all possible CPT codes: See Planned Interventions List for Planned CPT Codes    Check all conditions that are expected to impact treatment: Contractures, spasticity or fracture relevant to requested treatment   If treatment provided at initial evaluation, no treatment charged due to lack of authorization.       Jill Alexanders Tanaysha Alkins, PT, DPT 02/14/2023, 8:51 AM

## 2023-02-19 ENCOUNTER — Ambulatory Visit: Payer: Medicaid Other | Admitting: Physical Therapy

## 2023-02-21 ENCOUNTER — Ambulatory Visit: Payer: Medicaid Other | Admitting: Physical Therapy

## 2023-02-26 ENCOUNTER — Ambulatory Visit: Payer: Medicaid Other | Admitting: Physical Therapy

## 2023-02-26 DIAGNOSIS — R2689 Other abnormalities of gait and mobility: Secondary | ICD-10-CM

## 2023-02-26 DIAGNOSIS — M6281 Muscle weakness (generalized): Secondary | ICD-10-CM

## 2023-02-26 DIAGNOSIS — M5459 Other low back pain: Secondary | ICD-10-CM

## 2023-02-26 NOTE — Therapy (Addendum)
OUTPATIENT PHYSICAL THERAPY THORACOLUMBAR TREATMENT- 10TH VISIT PROGRESS NOTE AND RECERTIFICATION   Patient Name: Rebecca Blackburn MRN: [id number] DOB:09/22/1985, 37 y.o., female Today's Date: 02/26/2023  Physical Therapy Progress Note   Dates of Reporting Period:01/09/23 - 02/26/23  See Note below for Objective Data and Assessment of Progress/Goals.    END OF SESSION:  PT End of Session - 02/26/23 0808     Visit Number 10    Number of Visits 23   recert   Date for PT Re-Evaluation 04/23/23    Authorization Type [place] Medicaid [employer]    Progress Note Due on Visit 10    PT Start Time 0807   Pt in restroom   PT Stop Time 0848    PT Time Calculation (min) 41 min    Activity Tolerance Patient tolerated treatment well    Behavior During Therapy WFL for tasks assessed/performed              Past Medical History:  Diagnosis Date   Asthma    High blood pressure    Migraine    Migraines    Varicella    Past Surgical History:  Procedure Laterality Date   CESAREAN SECTION     2016 and 2006   LUMBAR MICRODISCECTOMY  06/24/2020   SKIN BIOPSY     TONSILLECTOMY     tooth removal Right    Patient Active Problem List   Diagnosis Date Noted   Varicose 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 04/16/2022   Chronic neck pain 10/19/2020   Myofascial pain syndrome, cervical 10/19/2020   History of 2019 novel coronavirus disease (COVID-19) 09/06/2020   Mild intermittent asthma without complication 09/06/2020   S/P lumbar microdiscectomy 07/25/2020   COVID-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 06/14/2020   Nasal turbinate hypertrophy 03/15/2020   Corn of foot 01/28/2020   Chronic nonintractable headache 01/04/2020   Deviated septum 01/04/2020   ETD (Eustachian tube dysfunction), bilateral 01/04/2020   Migraine with aura and without status migrainosus, not intractable 03/18/2019   Seborrheic dermatitis 02/13/2019   Dizziness 01/13/2019   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   ASCUS of  cervix with negative high risk HPV 11/20/2018   Encounter for gynecological examination without abnormal finding 11/07/2018   Genital warts 09/30/2018   History of loop electrical excision procedure (LEEP) 09/30/2018   Arthritis of neck 01/23/2018   Essential hypertension 01/23/2018   GERD with esophagitis 01/23/2018   Migraine without aura and without status migrainosus, not intractable 01/23/2018   Morbid obesity due to excess calories (HCC) 01/23/2018    PCP: Lavonda Jumbo, PA-C  REFERRING PROVIDER: Su Monks, PA-C  REFERRING DIAG: M54.50 (ICD-10-CM) - Low back pain, unspecified M54.6 (ICD-10-CM) - Pain in thoracic spine  Rationale for Evaluation and Treatment: Rehabilitation  THERAPY DIAG:  Other abnormalities of gait and mobility  Other low back pain  Muscle weakness (generalized)  ONSET DATE: 01/01/2023 (referral)   SUBJECTIVE:  SUBJECTIVE STATEMENT: Pt reports she has been sick for the past week and has not been able to do much. Feels as though her pain has moved from her muscles to her spine. Got a massage yesterday which aggravated her pain, was achy after. Is sore today, rating as a 6/10   PERTINENT HISTORY:  Had a lumbar discectomy 2 years ago   PAIN:  Are you having pain? No   PRECAUTIONS: Fall  RED FLAGS: None   WEIGHT BEARING RESTRICTIONS: No  FALLS:  Has patient fallen in last 6 months? 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  DIAGNOSTIC FINDINGS:  X-Ray of Lumbar spine from 12/18/2022 IMPRESSION:   1. Slight retrolisthesis of L2 on L3 and L3 on L4.  2.  Dextroconvex curvature of the lumbar spine with weightbearing.  3.  Mild multilevel degenerative disc height loss.  4.  No acute fractures identified.   PATIENT SURVEYS:  Modified Oswestry 14/50 (11/7)   SCREENING FOR RED FLAGS: Bowel or bladder incontinence: No Spinal tumors: No Cauda equina syndrome: No Compression fracture: No Abdominal aneurysm: No  COGNITION: Overall cognitive status: Within functional limits for tasks assessed     SENSATION: Pt denies numbness/tingling of BLEs but states she occasionally has numbness above her right latissimus dorsi m.  POSTURE: rounded shoulders, forward head, and increased lumbar lordosis  PALPATION: TTP along origin points of R lat. Dorsi m., R paraspinals and R upper trap   LUMBAR ROM: Tested in standing position   AROM eval 02/26/23  Flexion WNL - decreased lumbar lordosis    Extension WNL   Right lateral flexion WNL, minor pain   Left lateral flexion WNL   Right rotation Lacking 50% ROM, moderate pain WNL, minor pain   Left rotation WNL, minor pain    (Blank rows = not tested)   GAIT: Distance walked: Various clinic distances  Assistive device utilized: None Level of assistance: Complete Independence Comments: Decreased gait speed, waddle-like gait pattern w/decreased truncal rotation   VITALS There were no vitals filed for this visit.   TODAY'S TREATMENT:         Ther Act  Discussed POC moving forward and pt requesting to add more appointments as she does feel PT has been beneficial. Will send recertification today.  Discussed plan to return to spine center and request updated MRI. Informed pt that she is welcome to request updated imaging if she would like to have it, but is not required for therapy. Reviewed pt's most recent lumbar spine MRI from  2022 and educated pt on terminology used in the results (DDD, stenosis). Pt verbalized understanding.   LTG Assessment  Modified Oswestry: 20/50 (increased from previous assessment)  Assessed R rotation ROM of lumbar spine (see above) and WNL w/minor pain reported.   Ther Ex  SciFit multi-peaks level 10 for 8 minutes using BUE/BLEs for neural priming for reciprocal movement, dynamic cardiovascular warmup and increased amplitude of stepping.  PATIENT EDUCATION:  Education details: Goal results, POC moving forward, continue HEP  Person educated: Patient Education method: Explanation Education comprehension: verbalized understanding  HOME EXERCISE PROGRAM: Access Code: [email or web address] URL: [email or web address] Date: 01/17/2023 Prepared by: Alethia Berthold Karianna Gusman  Exercises - Lower Trunk Rotation Stretch  - 1 x daily - 7 x weekly - 3 sets - 10 reps - Posterior pelvic tilt  - 1 x daily - 7 x weekly - 3 sets - 10 reps - Supine Bridge with Pelvic Floor Contraction  - 1 x daily - 7 x weekly - 3 sets - 10 reps - Sidelying Thoracic Rotation with Open Book  - 1 x daily - 7 x weekly - 3 sets - 10 reps - Supine Straight Leg Lumbar Rotation Stretch  - 1 x daily - 7 x weekly - 3 sets - 10 reps - Cat Cow  - 1 x daily - 7 x weekly - 3 sets - 10 reps - Single Arm Doorway Pec Stretch at 90 Degrees Abduction  - 1 x daily - 7 x weekly - 3 sets - 45-60 seconds hold - Prone Chest Stretch on Chair  - 1 x daily - 7 x weekly - 3 sets - 30-45 second hold - Standing Anti-Rotation Press with Anchored Resistance  - 1 x daily - 7 x weekly - 3 sets - 10 reps - Kneeling Adductor Stretch with Hip External Rotation  - 1 x daily - 7 x weekly - 3 sets - 10 reps - Half Kneeling Hip Flexor Stretch  - 1 x daily - 7 x weekly - 3 sets - 30-60 second hold  ASSESSMENT:  CLINICAL IMPRESSION: Emphasis of skilled PT session on  LTG assessment and discussion of POC moving forward. Pt has met 1 of 3 LTGs, improving her R thoracic rotation ROM to functional level w/reduced pain reported w/movement. Pt did not meet her Modified Oswestry goal, scoring higher today than on previous assessment, indicative of increased pain levels. Pt in agreement to add more PT appointments into new year, so will continue to progress towards final HEP goal. Pt continues to be limited by mid-thoracic back pain w/tightness in latissimus dorsi on R side and L shoulder pain, but overall has improved her mobility and activity tolerance since starting PT. Continue POC.    OBJECTIVE IMPAIRMENTS: decreased activity tolerance, decreased mobility, decreased ROM, decreased strength, increased muscle spasms, impaired flexibility, impaired UE functional use, improper body mechanics, and pain.   ACTIVITY LIMITATIONS: carrying, lifting, standing, reach over head, and locomotion level  PARTICIPATION LIMITATIONS: cleaning, laundry, community activity, and occupation  PERSONAL FACTORS: Fitness and occupation  are also affecting patient's functional outcome.   REHAB POTENTIAL: Good  CLINICAL DECISION MAKING: Stable/uncomplicated  EVALUATION COMPLEXITY: Low   GOALS: Goals reviewed with patient? Yes  SHORT TERM GOALS: Target date: 02/06/2023   Pt will be independent with initial HEP for improved strength, spinal mobility and reduced pain levels.  Baseline: not established on eval  Goal status: MET  2.  Modified Oswestry to be assessed and LTG updated  Baseline: 14/50 Goal status: MET  3.  Pt will demonstrate proper lifting technique of 20# object from floor and from overhead shelf for improved body mechanics at work and reduced pain levels  Baseline: 20# DL on 47/82 Goal status: PARTIALLY MET   LONG TERM GOALS: Target date: 02/20/2023   Pt will be independent with final HEP for improved strength, spinal mobility and reduced pain  levels. Baseline:  Goal status: IN PROGRESS  2.  Pt will improve score on Modified Oswestry to </= 8/50 for reduced pain levels and improved quality of life   Baseline: 14/50; 20/50 (12/17)  Goal status: NOT MET   3.  Pt will improve R lumbar rotation to normal limits w/min pain for improved functional mobility and body mechanics  Baseline: lacking 50% ROM w/moderate pain  Goal status: MET  NEW SHORT TERM GOALS:   Target date: 03/26/2023  Pt will demonstrate proper lifting technique of 20# object from overhead shelf for improved body mechanics at work and reduced pain levels  Baseline:  Goal status: INITIAL   NEW LONG TERM GOALS:  Target date: 04/23/2023  Pt will improve score on Modified Oswestry to </= 12/50 for reduced pain levels and improved quality of life   Baseline: 20/50 Goal status: INITIAL  2.  Pt will be independent with final HEP for improved strength, spinal mobility and reduced pain levels. Baseline:  Goal status: IN PROGRESS   PLAN:  PT FREQUENCY: 1-2x/week (recert)   PT DURATION: 6 weeks + 8 weeks (recert)   PLANNED INTERVENTIONS: 29562- PT Re-evaluation, 97110-Therapeutic exercises, 97530- Therapeutic activity, [id number]- Neuromuscular re-education, 97535- Self Care, 13086- Manual therapy, [id number]- Gait training, [phone]- Aquatic Therapy, [phone]- Electrical stimulation (manual), Balance training, Taping, Dry Needling, Joint mobilization, Joint manipulation, Spinal manipulation, and Spinal mobilization.  PLAN FOR NEXT SESSION:  Prone lying. Lifting overhead. Core stability. Lat rollout. Rotation and OH work. Add to HEP for improved spinal mobility and periscapular strength. Lifting technique. Hip abductor strength, waiter's carries  Check all possible CPT codes: See Planned Interventions List for Planned CPT Codes    Check all conditions that are expected to impact treatment: Contractures, spasticity or fracture relevant to requested treatment   If treatment  provided at initial evaluation, no treatment charged due to lack of authorization.       Jill Alexanders France Lusty, PT, DPT 02/26/2023, 11:15 AM

## 2023-02-28 ENCOUNTER — Ambulatory Visit: Payer: Medicaid Other | Admitting: Physical Therapy

## 2023-02-28 DIAGNOSIS — M5459 Other low back pain: Secondary | ICD-10-CM | POA: Diagnosis not present

## 2023-02-28 DIAGNOSIS — M6281 Muscle weakness (generalized): Secondary | ICD-10-CM

## 2023-02-28 DIAGNOSIS — R2689 Other abnormalities of gait and mobility: Secondary | ICD-10-CM

## 2023-02-28 NOTE — Therapy (Signed)
OUTPATIENT PHYSICAL THERAPY THORACOLUMBAR TREATMENT   Patient Name: Rebecca Blackburn MRN: [id number] DOB:[birth year], 37 y.o., female Today's Date: 02/28/2023    END OF SESSION:  PT End of Session - 02/28/23 0806     Visit Number 11    Number of Visits 23   recert   Date for PT Re-Evaluation 04/23/23    Authorization Type  Medicaid [employer]    Progress Note Due on Visit 10    PT Start Time 0804    PT Stop Time 0844    PT Time Calculation (min) 40 min    Activity Tolerance Patient tolerated treatment well    Behavior During Therapy [facility] for tasks assessed/performed              Past Medical History:  Diagnosis Date   Asthma    High blood pressure    Migraine    Migraines    Varicella    Past Surgical History:  Procedure Laterality Date   CESAREAN SECTION     2016 and 2006   LUMBAR MICRODISCECTOMY  06/24/2020   SKIN BIOPSY     TONSILLECTOMY     tooth removal Right    Patient Active Problem List   Diagnosis Date Noted   Varicose 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 04/16/2022   Chronic neck pain 10/19/2020   Myofascial pain syndrome, cervical 10/19/2020   History of 2019 novel coronavirus disease (COVID-19) 09/06/2020   Mild intermittent asthma without complication 09/06/2020   S/P lumbar microdiscectomy 07/25/2020   COVID-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 06/14/2020   Nasal turbinate hypertrophy 03/15/2020   Corn of foot 01/28/2020   Chronic nonintractable headache 01/04/2020   Deviated septum 01/04/2020   ETD (Eustachian tube dysfunction), bilateral 01/04/2020   Migraine with aura and without status migrainosus, not intractable 03/18/2019   Seborrheic dermatitis 02/13/2019   Dizziness 01/13/2019   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   ASCUS of cervix with negative high risk HPV 11/20/2018   Encounter for gynecological examination without abnormal finding 11/07/2018   Genital warts 09/30/2018   History of loop electrical excision procedure (LEEP) 09/30/2018    Arthritis of neck 01/23/2018   Essential hypertension 01/23/2018   GERD with esophagitis 01/23/2018   Migraine without aura and without status migrainosus, not intractable 01/23/2018   Morbid obesity due to excess calories (HCC) 01/23/2018    PCP: Lavonda Jumbo, PA-C  REFERRING PROVIDER: Su Monks, PA-C  REFERRING DIAG: M54.50 (ICD-10-CM) - Low back pain, unspecified M54.6 (ICD-10-CM) - Pain in thoracic spine  Rationale for Evaluation and Treatment: Rehabilitation  THERAPY DIAG:  Other abnormalities of gait and mobility  Other low back pain  Muscle weakness (generalized)  ONSET DATE: 01/01/2023 (referral)   SUBJECTIVE:  SUBJECTIVE STATEMENT: Pt reports she is very sore today, rating it as an 8/10. Could not work her entire shift yesterday and took Motrin and muscle relaxants for her aches/pains but did not help much. Wants to work on stretching today.   PERTINENT HISTORY:  Had a lumbar discectomy 2 years ago   PAIN:  Are you having pain? No   PRECAUTIONS: Fall  RED FLAGS: None   WEIGHT BEARING RESTRICTIONS: No  FALLS:  Has patient fallen in last 6 months? 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  DIAGNOSTIC FINDINGS:  X-Ray of Lumbar spine from 12/18/2022 IMPRESSION:   1. Slight retrolisthesis of L2 on L3 and L3 on L4.  2.  Dextroconvex curvature of the lumbar spine with weightbearing.  3.  Mild  multilevel degenerative disc height loss.  4.  No acute fractures identified.   PATIENT SURVEYS:  Modified Oswestry 14/50 (11/7)   SCREENING FOR RED FLAGS: Bowel or bladder incontinence: No Spinal tumors: No Cauda equina syndrome: No Compression fracture: No Abdominal aneurysm: No  COGNITION: Overall cognitive status: Within functional limits for tasks assessed     SENSATION: Pt denies numbness/tingling of BLEs but states she occasionally has numbness above her right latissimus dorsi m.  POSTURE: rounded shoulders, forward head, and increased lumbar lordosis  PALPATION: TTP along origin points of R lat. Dorsi m., R paraspinals and R upper trap   LUMBAR ROM: Tested in standing position   AROM eval 02/26/23  Flexion WNL - decreased lumbar lordosis    Extension WNL   Right lateral flexion WNL, minor pain   Left lateral flexion WNL   Right rotation Lacking 50% ROM, moderate pain WNL, minor pain   Left rotation WNL, minor pain    (Blank rows = not tested)   GAIT: Distance walked: Various clinic distances  Assistive device utilized: None Level of assistance: Complete Independence Comments: Decreased gait speed, waddle-like gait pattern w/decreased truncal rotation   VITALS There were no vitals filed for this visit.   TODAY'S TREATMENT:          Ther Ex  SciFit multi-peaks level 12 for 8 minutes using BUE/BLEs for neural priming for reciprocal movement, dynamic cardiovascular warmup and increased amplitude of stepping.  Child's pose to cobra flow, x10 reps, for improved spinal mobility and pain modulation.  Child's pose walkouts to R and L sides for latissimus dorsi stretch, 3x30s per side.  Scorpions, x8 per side, for improved thoracic and lumbar mobility and anterior chest stretch.  Prone scapular retraction Y, T, A's over cones for improved periscapular strength and shoulder mobility, x10 reps.  Face pulls w/red theraband, x12 reps. Min cues for scapular retraction  and avoiding shoulder shrug.  PATIENT EDUCATION:  Education details: continue HEP  Person educated: Patient Education method: Explanation Education comprehension: verbalized understanding  HOME EXERCISE PROGRAM: Access Code: [email or web address] URL: [email or web address] Date: 01/17/2023 Prepared by: Alethia Berthold Venise Ellingwood  Exercises - Lower Trunk Rotation Stretch  - 1 x daily - 7 x weekly - 3 sets - 10 reps - Posterior pelvic tilt  - 1 x daily - 7 x weekly - 3 sets - 10 reps - Supine Bridge with Pelvic Floor Contraction  - 1 x daily - 7 x weekly - 3 sets - 10 reps - Sidelying Thoracic Rotation with Open Book  - 1 x daily - 7 x weekly - 3 sets - 10 reps - Supine Straight Leg Lumbar Rotation Stretch  - 1 x daily - 7 x weekly - 3 sets - 10 reps - Cat Cow  - 1 x daily - 7 x weekly - 3 sets - 10 reps - Single Arm Doorway Pec Stretch at 90 Degrees Abduction  - 1 x daily - 7 x weekly - 3 sets - 45-60 seconds hold - Prone Chest Stretch on Chair  - 1 x daily - 7 x weekly - 3 sets - 30-45 second hold - Standing Anti-Rotation Press with Anchored Resistance  - 1 x daily - 7 x weekly - 3 sets - 10 reps - Kneeling Adductor Stretch with Hip External Rotation  - 1 x daily - 7 x weekly - 3 sets - 10 reps - Half Kneeling Hip Flexor Stretch  - 1 x daily - 7 x weekly - 3 sets - 30-60 second hold  ASSESSMENT:  CLINICAL IMPRESSION: Emphasis of skilled PT session on periscapular strength, spinal mobility and pain modulation. Pt reporting 8/10 soreness at beginning of session and states she could not tolerate full work shift day prior. Unsure if pt's aches/soreness is related to recent illness as massage ans muscle relaxants were not beneficial. Pt tolerated session well w/no increase in discomfort but did report her back "catching" w/scorpions and face pulls. Continue POC.    OBJECTIVE IMPAIRMENTS: decreased  activity tolerance, decreased mobility, decreased ROM, decreased strength, increased muscle spasms, impaired flexibility, impaired UE functional use, improper body mechanics, and pain.   ACTIVITY LIMITATIONS: carrying, lifting, standing, reach over head, and locomotion level  PARTICIPATION LIMITATIONS: cleaning, laundry, community activity, and occupation  PERSONAL FACTORS: Fitness and occupation  are also affecting patient's functional outcome.   REHAB POTENTIAL: Good  CLINICAL DECISION MAKING: Stable/uncomplicated  EVALUATION COMPLEXITY: Low   GOALS: Goals reviewed with patient? Yes  SHORT TERM GOALS: Target date: 02/06/2023   Pt will be independent with initial HEP for improved strength, spinal mobility and reduced pain levels.  Baseline: not established on eval  Goal status: MET  2.  Modified Oswestry to be assessed and LTG updated  Baseline: 14/50 Goal status: MET  3.  Pt will demonstrate proper lifting technique of 20# object from floor and from overhead shelf for improved body mechanics at work and reduced pain levels  Baseline: 20# DL on 47/82 Goal status: PARTIALLY MET   LONG TERM GOALS: Target date: 02/20/2023   Pt will be independent with final HEP for improved strength, spinal mobility and reduced pain levels. Baseline:  Goal status: IN PROGRESS   2.  Pt will improve score on Modified Oswestry to </= 8/50 for reduced pain levels and improved quality of life   Baseline: 14/50; 20/50 (12/17)  Goal status: NOT MET   3.  Pt will improve R lumbar rotation to  normal limits w/min pain for improved functional mobility and body mechanics  Baseline: lacking 50% ROM w/moderate pain  Goal status: MET  NEW SHORT TERM GOALS:   Target date: 03/26/2023  Pt will demonstrate proper lifting technique of 20# object from overhead shelf for improved body mechanics at work and reduced pain levels  Baseline:  Goal status: INITIAL   NEW LONG TERM GOALS:  Target date:  04/23/2023  Pt will improve score on Modified Oswestry to </= 12/50 for reduced pain levels and improved quality of life   Baseline: 20/50 Goal status: INITIAL  2.  Pt will be independent with final HEP for improved strength, spinal mobility and reduced pain levels. Baseline:  Goal status: IN PROGRESS   PLAN:  PT FREQUENCY: 1-2x/week (recert)   PT DURATION: 6 weeks + 8 weeks (recert)   PLANNED INTERVENTIONS: 65784- PT Re-evaluation, 97110-Therapeutic exercises, 97530- Therapeutic activity, [id number]- Neuromuscular re-education, 97535- Self Care, 69629- Manual therapy, [id number]- Gait training, [phone]- Aquatic Therapy, [phone]- Electrical stimulation (manual), Balance training, Taping, Dry Needling, Joint mobilization, Joint manipulation, Spinal manipulation, and Spinal mobilization.  PLAN FOR NEXT SESSION:  Prone lying. Lifting overhead. Core stability. Lat rollout. Rotation and OH work. Add to HEP for improved spinal mobility and periscapular strength. Lifting technique. Hip abductor strength, waiter's carries  Check all possible CPT codes: See Planned Interventions List for Planned CPT Codes    Check all conditions that are expected to impact treatment: Contractures, spasticity or fracture relevant to requested treatment   If treatment provided at initial evaluation, no treatment charged due to lack of authorization.       Jill Alexanders Perris Tripathi, PT, DPT 02/28/2023, 8:45 AM

## 2023-03-11 ENCOUNTER — Ambulatory Visit: Payer: Medicaid Other | Admitting: Physical Therapy

## 2023-03-11 DIAGNOSIS — M5459 Other low back pain: Secondary | ICD-10-CM | POA: Diagnosis not present

## 2023-03-11 DIAGNOSIS — M6281 Muscle weakness (generalized): Secondary | ICD-10-CM

## 2023-03-11 DIAGNOSIS — R2689 Other abnormalities of gait and mobility: Secondary | ICD-10-CM

## 2023-03-11 NOTE — Therapy (Signed)
OUTPATIENT PHYSICAL THERAPY THORACOLUMBAR TREATMENT   Patient Name: Rebecca Blackburn MRN: [id number] DOB:1986-02-18, 37 y.o., female Today's Date: 03/11/2023    END OF SESSION:  PT End of Session - 03/11/23 0804     Visit Number 12    Number of Visits 23   recert   Date for PT Re-Evaluation 04/23/23    Authorization Type [place] Medicaid [employer]    Authorization Time Period 8 visits approved from 02/22/2023-04/23/2023    Progress Note Due on Visit 20    PT Start Time 0802    PT Stop Time 0849    PT Time Calculation (min) 47 min    Activity Tolerance Patient tolerated treatment well    Behavior During Therapy [facility] for tasks assessed/performed              Past Medical History:  Diagnosis Date   Asthma    High blood pressure    Migraine    Migraines    Varicella    Past Surgical History:  Procedure Laterality Date   CESAREAN SECTION     2016 and 2006   LUMBAR MICRODISCECTOMY  06/24/2020   SKIN BIOPSY     TONSILLECTOMY     tooth removal Right    Patient Active Problem List   Diagnosis Date Noted   Varicose 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 04/16/2022   Chronic neck pain 10/19/2020   Myofascial pain syndrome, cervical 10/19/2020   History of 2019 novel coronavirus disease (COVID-19) 09/06/2020   Mild intermittent asthma without complication 09/06/2020   S/P lumbar microdiscectomy 07/25/2020   COVID-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 06/14/2020   Nasal turbinate hypertrophy 03/15/2020   Corn of foot 01/28/2020   Chronic nonintractable headache 01/04/2020   Deviated septum 01/04/2020   ETD (Eustachian tube dysfunction), bilateral 01/04/2020   Migraine with aura and without status migrainosus, not intractable 03/18/2019   Seborrheic dermatitis 02/13/2019   Dizziness 01/13/2019   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   ASCUS of cervix with negative high risk HPV 11/20/2018   Encounter for gynecological examination without abnormal finding 11/07/2018   Genital warts  09/30/2018   History of loop electrical excision procedure (LEEP) 09/30/2018   Arthritis of neck 01/23/2018   Essential hypertension 01/23/2018   GERD with esophagitis 01/23/2018   Migraine without aura and without status migrainosus, not intractable 01/23/2018   Morbid obesity due to excess calories (HCC) 01/23/2018    PCP: Lavonda Jumbo, PA-C  REFERRING PROVIDER: Su Monks, PA-C  REFERRING DIAG: M54.50 (ICD-10-CM) - Low back pain, unspecified M54.6 (ICD-10-CM) - Pain in thoracic spine  Rationale for Evaluation and Treatment: Rehabilitation  THERAPY DIAG:  Other abnormalities of gait and mobility  Other low back pain  Muscle weakness (generalized)  ONSET DATE: 01/01/2023 (referral)   SUBJECTIVE:  SUBJECTIVE STATEMENT: Pt reports doing "meh". States if she could take a week off of work, she thinks her back would relax. States she is very tight today, had to call out Friday due to pain. Has not taken a muscle relaxer in a few days, feels more tightness in her spine versus her muscle. Rating her soreness as a 5-6/10, states it was an 8/10 on Friday. Has been working on her stretches as she is able.   PERTINENT HISTORY:  Had a lumbar discectomy 2 years ago   PAIN:  Are you having pain? No   PRECAUTIONS: Fall  RED FLAGS: None   WEIGHT BEARING RESTRICTIONS: No  FALLS:  Has patient fallen in last 6 months? 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  DIAGNOSTIC FINDINGS:  X-Ray of Lumbar spine from 12/18/2022 IMPRESSION:   1. Slight retrolisthesis of L2 on L3 and L3 on L4.  2.  Dextroconvex curvature of the lumbar spine with weightbearing.  3.  Mild multilevel degenerative disc height loss.  4.  No acute fractures identified.   PATIENT SURVEYS:  Modified Oswestry 14/50 (11/7)   SCREENING FOR RED FLAGS: Bowel or bladder incontinence: No Spinal tumors: No Cauda equina syndrome: No Compression fracture: No Abdominal aneurysm: No  COGNITION: Overall cognitive status: Within functional limits for tasks assessed     SENSATION: Pt denies numbness/tingling of BLEs but states she occasionally has numbness above her right latissimus dorsi m.  POSTURE: rounded shoulders, forward head, and increased lumbar lordosis  PALPATION: TTP along origin points of R lat. Dorsi m., R paraspinals and R upper trap   LUMBAR ROM: Tested in standing position   AROM eval 02/26/23  Flexion WNL - decreased lumbar lordosis    Extension WNL   Right lateral flexion WNL, minor pain   Left lateral flexion WNL   Right rotation Lacking 50% ROM, moderate pain WNL, minor pain   Left rotation WNL, minor pain    (Blank rows = not tested)   GAIT: Distance walked: Various clinic distances  Assistive device utilized: None Level of assistance: Complete Independence Comments: Decreased gait speed, waddle-like gait pattern w/decreased truncal rotation   VITALS There were no vitals filed for this visit.   TODAY'S TREATMENT:         There Act  Discussed progress in PT thus far and recommendation to return to spine center as she continues to have low back pain. Pt reports she does think PT has been beneficial but she is worried about having another ruptured disc or issues where her previous surgery was. Pt states she does have new anal pain that started about a month ago and is having pain along scar in lumbar region. Pt  to message spine center today to request follow-up appointment.  Discussed impact that pt's job/stress has on her pain. Pt in agreement that these are contributing factors and is in process of changing careers.   Discussed nutrition and impact of weight on back pain. Pt reports she was referred to weight loss clinic several years ago but was in school at the time and could not commit to weight loss. Pt reports she  would like to work on this, encouraged her to discuss this w/MD.   Ther Ex  Dead bug isometric holds, 8x10s, for improved functional core stability. Pt very challenged by this and reported feeling tension (not pain) in low back. Encouraged pt to work on [employer] at home and added dead bugs to HEP (see bolded below)                                                                                                  PATIENT EDUCATION:  Education details: additions to HEP  Person educated: Patient Education method: Explanation, Demonstration, and Handouts Education comprehension: verbalized understanding and returned demonstration  HOME EXERCISE PROGRAM: Access Code: [email or web address] URL: [email or web address] Date: 01/17/2023 Prepared by: Alethia Berthold Sebasthian Stailey  Exercises - Lower Trunk Rotation Stretch  - 1 x daily - 7 x weekly - 3 sets - 10 reps - Posterior pelvic tilt  - 1 x daily - 7 x weekly - 3 sets - 10 reps - Supine Bridge with Pelvic Floor Contraction  - 1 x daily - 7 x weekly - 3 sets - 10 reps - Sidelying Thoracic Rotation with Open Book  - 1 x daily - 7 x weekly - 3 sets - 10 reps - Supine Straight Leg Lumbar Rotation Stretch  - 1 x daily - 7 x weekly - 3 sets - 10 reps - Cat Cow  - 1 x daily - 7 x weekly - 3 sets - 10 reps - Single Arm Doorway Pec Stretch at 90 Degrees Abduction  - 1 x daily - 7 x weekly - 3 sets - 45-60 seconds hold - Prone Chest Stretch on Chair  - 1 x daily - 7 x weekly - 3 sets - 30-45 second hold - Standing Anti-Rotation Press with Anchored  Resistance  - 1 x daily - 7 x weekly - 3 sets - 10 reps - Kneeling Adductor Stretch with Hip External Rotation  - 1 x daily - 7 x weekly - 3 sets - 10 reps - Half Kneeling Hip Flexor Stretch  - 1 x daily - 7 x weekly - 3 sets - 30-60 second hold - Isometric Dead Bug  - 1 x daily - 7 x weekly - 10 reps - 5-10 second hold  ASSESSMENT:  CLINICAL IMPRESSION: Emphasis of skilled PT session on pt education regarding next steps in PT and adding core stability exercises to HEP. Pt to reach out to spine center to request follow-up appointment as she continues to be limited by back pain despite consistency w/PT. Pt demonstrates very poor core stability and had significant difficulty performing abdominal isometric exercise today, so will continue to work on this in PT. Continue POC.    OBJECTIVE IMPAIRMENTS: decreased activity tolerance, decreased mobility, decreased ROM, decreased strength, increased muscle spasms, impaired flexibility, impaired UE functional use, improper body mechanics, and pain.   ACTIVITY LIMITATIONS: carrying, lifting, standing, reach over head, and locomotion level  PARTICIPATION LIMITATIONS: cleaning, laundry, community activity, and occupation  PERSONAL FACTORS: Fitness and occupation  are also affecting patient's functional outcome.   REHAB POTENTIAL: Good  CLINICAL DECISION MAKING: Stable/uncomplicated  EVALUATION COMPLEXITY: Low   GOALS: Goals reviewed with patient? Yes  SHORT TERM GOALS: Target date: 02/06/2023   Pt will be independent with initial HEP for improved strength, spinal mobility and reduced pain levels.  Baseline: not established on eval  Goal status: MET  2.  Modified Oswestry to be assessed and LTG updated  Baseline: 14/50 Goal status: MET  3.  Pt will demonstrate proper lifting technique of 20# object from floor and from overhead shelf for improved body mechanics at work and reduced pain levels  Baseline: 20# DL on 21/30 Goal status:  PARTIALLY MET   LONG TERM GOALS: Target date: 02/20/2023   Pt will be independent with final HEP for improved strength, spinal mobility and reduced pain levels. Baseline:  Goal status: IN PROGRESS   2.  Pt will improve score on Modified Oswestry to </= 8/50 for reduced pain levels and improved quality of life   Baseline: 14/50; 20/50 (12/17)  Goal status: NOT MET   3.  Pt will improve R lumbar rotation to normal limits w/min pain for improved functional mobility and body mechanics  Baseline: lacking 50% ROM w/moderate pain  Goal status: MET  NEW SHORT TERM GOALS:   Target date: 03/26/2023  Pt will demonstrate proper lifting technique of 20# object from overhead shelf for improved body mechanics at work and reduced pain levels  Baseline:  Goal status: INITIAL   NEW LONG TERM GOALS:  Target date: 04/23/2023  Pt will improve score on Modified Oswestry to </= 12/50 for reduced pain levels and improved quality of life   Baseline: 20/50 Goal status: INITIAL  2.  Pt will be independent with final HEP for improved strength, spinal mobility and reduced pain levels. Baseline:  Goal status: IN PROGRESS   PLAN:  PT FREQUENCY: 1-2x/week (recert)   PT DURATION: 6 weeks + 8 weeks (recert)   PLANNED INTERVENTIONS: 86578- PT Re-evaluation, 97110-Therapeutic exercises, 97530- Therapeutic activity, [id number]- Neuromuscular re-education, 97535- Self Care, [id number]- Manual therapy, [id number]- Gait training, [phone]- Aquatic Therapy, [phone]- Electrical stimulation (manual), Balance training, Taping, Dry Needling, Joint mobilization, Joint manipulation, Spinal manipulation, and Spinal mobilization.  PLAN FOR NEXT SESSION:  Prone lying. Lifting overhead. Core stability. Lat rollout. Rotation and OH work. Add to HEP for improved spinal mobility and periscapular strength. Lifting technique. Hip abductor strength, waiter's carries  Check all possible CPT codes: See Planned Interventions List for Planned CPT  Codes    Check all conditions that are expected to impact treatment: Contractures, spasticity or fracture relevant to requested treatment   If treatment provided at initial evaluation, no treatment charged due to lack of authorization.       Jill Alexanders Sheryn Aldaz, PT, DPT 03/11/2023, 8:51 AM

## 2023-03-19 ENCOUNTER — Ambulatory Visit: Payer: Medicaid Other | Attending: Physical Medicine and Rehabilitation | Admitting: Physical Therapy

## 2023-03-26 ENCOUNTER — Telehealth: Payer: Self-pay | Admitting: Physical Therapy

## 2023-03-26 ENCOUNTER — Ambulatory Visit: Payer: Medicaid Other | Attending: Physician Assistant | Admitting: Physical Therapy

## 2023-03-26 DIAGNOSIS — M6281 Muscle weakness (generalized): Secondary | ICD-10-CM | POA: Insufficient documentation

## 2023-03-26 DIAGNOSIS — R2689 Other abnormalities of gait and mobility: Secondary | ICD-10-CM | POA: Insufficient documentation

## 2023-03-26 DIAGNOSIS — M5459 Other low back pain: Secondary | ICD-10-CM | POA: Insufficient documentation

## 2023-03-26 NOTE — Telephone Encounter (Signed)
 Attempted to call pt and leave VM regarding no show to scheduled PT appointment today. Pt's VM box not set up so unable to leave message at this time. Will reattempt at a later date.    Jill Alexanders Aleyah Balik, PT, DPT

## 2023-04-02 ENCOUNTER — Ambulatory Visit: Payer: Medicaid Other | Admitting: Physical Therapy

## 2023-04-02 DIAGNOSIS — M5459 Other low back pain: Secondary | ICD-10-CM | POA: Diagnosis present

## 2023-04-02 DIAGNOSIS — M6281 Muscle weakness (generalized): Secondary | ICD-10-CM

## 2023-04-02 DIAGNOSIS — R2689 Other abnormalities of gait and mobility: Secondary | ICD-10-CM

## 2023-04-02 NOTE — Therapy (Signed)
OUTPATIENT PHYSICAL THERAPY THORACOLUMBAR TREATMENT- DISCHARGE SUMMARY   Patient Name: Rebecca Blackburn MRN: [id number] DOB:1985-10-16, 38 y.o., female Today's Date: 04/02/2023  PHYSICAL THERAPY DISCHARGE SUMMARY  Visits from Start of Care: 13  Current functional level related to goals / functional outcomes: Independent w/ADLs    Remaining deficits: Decreased functional mobility, thoracic pain w/peripheralization, decreased activity tolerance, impaired sensation     Education / Equipment: HEP, plan to see neurosurgeon on 2/11 and return to PT if needed    Patient agrees to discharge. Patient goals were partially met. Patient is being discharged due to a change in medical status.    END OF SESSION:  PT End of Session - 04/02/23 0805     Visit Number 13    Number of Visits 23   recert   Date for PT Re-Evaluation 04/23/23    Authorization Type  Medicaid [employer]    Authorization Time Period 8 visits approved from 02/22/2023-04/23/2023    Progress Note Due on Visit 20    PT Start Time 0803    PT Stop Time 0847    PT Time Calculation (min) 44 min    Activity Tolerance Patient tolerated treatment well    Behavior During Therapy [facility] for tasks assessed/performed              Past Medical History:  Diagnosis Date   Asthma    High blood pressure    Migraine    Migraines    Varicella    Past Surgical History:  Procedure Laterality Date   CESAREAN SECTION     2016 and 2006   LUMBAR MICRODISCECTOMY  06/24/2020   SKIN BIOPSY     TONSILLECTOMY     tooth removal Right    Patient Active Problem List   Diagnosis Date Noted   Varicose 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 04/16/2022   Chronic neck pain 10/19/2020   Myofascial pain syndrome, cervical 10/19/2020   History of 2019 novel coronavirus disease (COVID-19) 09/06/2020   Mild intermittent asthma without complication 09/06/2020   S/P lumbar microdiscectomy 07/25/2020   COVID-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 06/14/2020    Nasal turbinate hypertrophy 03/15/2020   Corn of foot 01/28/2020   Chronic nonintractable headache 01/04/2020   Deviated septum 01/04/2020   ETD (Eustachian tube dysfunction), bilateral 01/04/2020   Migraine with aura and without status migrainosus, not intractable 03/18/2019   Seborrheic dermatitis 02/13/2019   Dizziness 01/13/2019   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   ASCUS of cervix with negative high risk HPV 11/20/2018   Encounter for gynecological examination without abnormal finding 11/07/2018   Genital warts 09/30/2018   History of loop electrical excision procedure (LEEP) 09/30/2018   Arthritis of neck 01/23/2018   Essential hypertension 01/23/2018   GERD with esophagitis 01/23/2018   Migraine without aura and without status migrainosus, not intractable 01/23/2018   Morbid obesity due to excess calories (HCC) 01/23/2018    PCP: Lavonda Jumbo, PA-C  REFERRING PROVIDER: Su Monks, PA-C  REFERRING DIAG: M54.50 (ICD-10-CM) - Low back pain, unspecified M54.6 (ICD-10-CM) - Pain in thoracic spine  Rationale for Evaluation and Treatment: Rehabilitation  THERAPY DIAG:  Other abnormalities of gait and mobility  Other low back pain  Muscle weakness (generalized)  ONSET DATE: 01/01/2023 (referral)   SUBJECTIVE:  SUBJECTIVE STATEMENT: Pt reports having a rough couple of weeks. Had updated imaging of her spine on 03/17/23 and sees the neurosurgeon on 2/11. Has not been to work since Friday so back is feeling really good. Has been applying to new jobs as she states her current job has a high impact on her pain.   PERTINENT HISTORY:  Had a lumbar discectomy 2 years ago   PAIN:  Are you having pain? No   PRECAUTIONS: Fall  RED FLAGS: None   WEIGHT BEARING RESTRICTIONS:  No  FALLS:  Has patient fallen in last 6 months? No  LIVING ENVIRONMENT: Lives with: lives with their daughter Lives in: House/apartment Stairs: No Has following equipment at home: None  OCCUPATION: Server at [employer]  PLOF: Independent  PATIENT GOALS: "Getting this knot out"   NEXT MD VISIT: 2/11  OBJECTIVE:  Note: Objective measures were completed at Evaluation unless otherwise noted.  DIAGNOSTIC FINDINGS:  X-Ray of Lumbar spine from 12/18/2022 IMPRESSION:   1. Slight retrolisthesis of L2 on L3 and L3 on L4.  2.  Dextroconvex curvature of the lumbar spine with weightbearing.  3.  Mild multilevel degenerative disc height loss.  4.  No acute fractures identified.   PATIENT SURVEYS:  Modified Oswestry 14/50 (11/7)   SCREENING FOR RED FLAGS: Bowel or bladder incontinence: No Spinal tumors: No Cauda equina syndrome: No Compression fracture: No Abdominal aneurysm: No  COGNITION: Overall cognitive status: Within functional limits for tasks assessed     SENSATION: Pt denies numbness/tingling of BLEs but states she occasionally has numbness above her right latissimus dorsi m.  POSTURE: rounded shoulders, forward head, and increased lumbar lordosis  PALPATION: TTP along origin points of R lat. Dorsi m., R paraspinals and R upper trap   LUMBAR ROM: Tested in standing position   AROM eval 02/26/23  Flexion WNL - decreased lumbar lordosis    Extension WNL   Right lateral flexion WNL, minor pain   Left lateral flexion WNL   Right rotation Lacking 50% ROM, moderate pain WNL, minor pain   Left rotation WNL, minor pain    (Blank rows = not tested)   GAIT: Distance walked: Various clinic distances  Assistive device utilized: None Level of assistance: Complete Independence Comments: Decreased gait speed, waddle-like gait pattern w/decreased truncal rotation   VITALS There were no vitals filed for this visit.   TODAY'S TREATMENT:         There Act   Discussed MRI results of lumbar and thoracic spine and POC moving forward. Pt to see neurosurgeon on 2/11 to discuss changes in lumbar spine since partially laminectomy and discectomy as well as bulging disc at T4-T5. Pt reports she has been working on her stretches but not as much on her core exercises as she feels as though she is straining. Educated pt on importance of prioritizing core stability to assist w/low back pain and spinal stability and added prone lying to HEP (see bolded below) to assist w/disc bulge. Pt in agreement to DC from PT at this time to save visits and allow time to see neurosurgeon   STG Assessment  Pt performed x5 deadlifts using 20# KB for improved body mechanics and posterior chain strength. Pt reported no discomfort in low back w/activity and demonstrated good body mechanics.  Did not assess from [facility] this date as pt worried about hurting back.  Provided new printout of HEP per pt request  PATIENT EDUCATION:  Education details: see above  Person educated: Patient Education method: Explanation, Demonstration, and Handouts Education comprehension: verbalized understanding and returned demonstration  HOME EXERCISE PROGRAM: Access Code: [email or web address] URL: [email or web address] Date: 01/17/2023 Prepared by: Alethia Berthold Ashford Clouse  Exercises - Lower Trunk Rotation Stretch  - 1 x daily - 7 x weekly - 3 sets - 10 reps - Posterior pelvic tilt  - 1 x daily - 7 x weekly - 3 sets - 10 reps - Supine Bridge with Pelvic Floor Contraction  - 1 x daily - 7 x weekly - 3 sets - 10 reps - Sidelying Thoracic Rotation with Open Book  - 1 x daily - 7 x weekly - 3 sets - 10 reps - Supine Straight Leg Lumbar Rotation Stretch  - 1 x daily - 7 x weekly - 3 sets - 10 reps - Cat Cow  - 1 x daily - 7 x weekly - 3 sets - 10 reps - Single Arm Doorway Pec Stretch at 90 Degrees Abduction  - 1 x daily  - 7 x weekly - 3 sets - 45-60 seconds hold - Prone Chest Stretch on Chair  - 1 x daily - 7 x weekly - 3 sets - 30-45 second hold - Standing Anti-Rotation Press with Anchored Resistance  - 1 x daily - 7 x weekly - 3 sets - 10 reps - Kneeling Adductor Stretch with Hip External Rotation  - 1 x daily - 7 x weekly - 3 sets - 10 reps - Half Kneeling Hip Flexor Stretch  - 1 x daily - 7 x weekly - 3 sets - 30-60 second hold - Isometric Dead Bug  - 1 x daily - 7 x weekly - 10 reps - 5-10 second hold - Static Prone on Elbows  - 1 x daily - 7 x weekly - 1 reps - 5-10 minute hold  ASSESSMENT:  CLINICAL IMPRESSION: Emphasis of skilled PT session on goal assessment and DC from PT. Pt has met 1 of 2 LTGs, with remainder of STG/LTG DC as pt has not been seen in therapy for ~4 weeks. Pt reports she returned to spine center and had updated MRI performed to lumbar and thoracic spine. Pt reports her imaging was "not good" and she has follow up w/neurosurgeon next month. Pt reports she feels good w/current HEP, but added prone lying to it to address new disc bulge and emphasized importance of core strengthening. Pt verbalized understanding and agreement to DC this date to conserve PT visits.    OBJECTIVE IMPAIRMENTS: decreased activity tolerance, decreased mobility, decreased ROM, decreased strength, increased muscle spasms, impaired flexibility, impaired UE functional use, improper body mechanics, and pain.   ACTIVITY LIMITATIONS: carrying, lifting, standing, reach over head, and locomotion level  PARTICIPATION LIMITATIONS: cleaning, laundry, community activity, and occupation  PERSONAL FACTORS: Fitness and occupation  are also affecting patient's functional outcome.   REHAB POTENTIAL: Good  CLINICAL DECISION MAKING: Stable/uncomplicated  EVALUATION COMPLEXITY: Low   GOALS: Goals reviewed with patient? Yes  SHORT TERM GOALS: Target date: 02/06/2023   Pt will be independent with initial HEP for  improved strength, spinal mobility and reduced pain levels.  Baseline: not established on eval  Goal status: MET  2.  Modified Oswestry to be assessed and LTG updated  Baseline: 14/50 Goal status: MET  3.  Pt will demonstrate proper lifting technique of 20# object from floor and from overhead shelf for improved body mechanics at work and reduced pain levels  Baseline: 20# DL  on 11/26 Goal status: PARTIALLY MET   LONG TERM GOALS: Target date: 02/20/2023   Pt will be independent with final HEP for improved strength, spinal mobility and reduced pain levels. Baseline:  Goal status: IN PROGRESS   2.  Pt will improve score on Modified Oswestry to </= 8/50 for reduced pain levels and improved quality of life   Baseline: 14/50; 20/50 (12/17)  Goal status: NOT MET   3.  Pt will improve R lumbar rotation to normal limits w/min pain for improved functional mobility and body mechanics  Baseline: lacking 50% ROM w/moderate pain  Goal status: MET  NEW SHORT TERM GOALS:   Target date: 03/26/2023  Pt will demonstrate proper lifting technique of 20# object from overhead shelf for improved body mechanics at work and reduced pain levels  Baseline:  Goal status: DC   NEW LONG TERM GOALS:  Target date: 04/23/2023  Pt will improve score on Modified Oswestry to </= 12/50 for reduced pain levels and improved quality of life   Baseline: 20/50 Goal status: DC  2.  Pt will be independent with final HEP for improved strength, spinal mobility and reduced pain levels. Baseline:  Goal status: MET   PLAN:  PT FREQUENCY: 1-2x/week (recert)   PT DURATION: 6 weeks + 8 weeks (recert)   PLANNED INTERVENTIONS: 11914- PT Re-evaluation, 97110-Therapeutic exercises, 97530- Therapeutic activity, [id number]- Neuromuscular re-education, 97535- Self Care, [id number]- Manual therapy, [id number]- Gait training, [phone]- Aquatic Therapy, [phone]- Electrical stimulation (manual), Balance training, Taping, Dry Needling, Joint  mobilization, Joint manipulation, Spinal manipulation, and Spinal mobilization.   Check all possible CPT codes: See Planned Interventions List for Planned CPT Codes    Check all conditions that are expected to impact treatment: Contractures, spasticity or fracture relevant to requested treatment   If treatment provided at initial evaluation, no treatment charged due to lack of authorization.       Jill Alexanders Mei Suits, PT, DPT 04/02/2023, 8:48 AM

## 2023-04-09 ENCOUNTER — Ambulatory Visit: Payer: Medicaid Other | Admitting: Physical Therapy

## 2023-04-15 ENCOUNTER — Ambulatory Visit: Payer: Medicaid Other | Admitting: Physical Therapy

## 2023-04-22 ENCOUNTER — Ambulatory Visit: Payer: Medicaid Other | Admitting: Physical Therapy

## 2023-10-01 ENCOUNTER — Other Ambulatory Visit: Payer: Self-pay | Admitting: Neurology

## 2024-02-24 ENCOUNTER — Ambulatory Visit: Admitting: Podiatry

## 2024-03-02 ENCOUNTER — Ambulatory Visit: Admitting: Podiatry
